# Patient Record
Sex: Male | Born: 1955 | Hispanic: Yes | Marital: Married | Smoking: Never smoker
Health system: Southern US, Community
[De-identification: ages and names within clinical notes are randomized; demographics above are authoritative.]

## PROBLEM LIST (undated history)

## (undated) DIAGNOSIS — F840 Autistic disorder: Secondary | ICD-10-CM

## (undated) DIAGNOSIS — E119 Type 2 diabetes mellitus without complications: Secondary | ICD-10-CM

## (undated) DIAGNOSIS — I10 Essential (primary) hypertension: Secondary | ICD-10-CM

---

## 2020-11-11 ENCOUNTER — Emergency Department (HOSPITAL_COMMUNITY): Payer: Self-pay

## 2020-11-11 ENCOUNTER — Inpatient Hospital Stay (HOSPITAL_COMMUNITY)
Admission: EM | Admit: 2020-11-11 | Discharge: 2020-11-16 | DRG: 981 | Disposition: A | Discharge status: Home / Self Care | Payer: Self-pay | Attending: Family Medicine | Admitting: Family Medicine

## 2020-11-11 ENCOUNTER — Encounter (HOSPITAL_COMMUNITY): Payer: Self-pay | Admitting: Emergency Medicine

## 2020-11-11 DIAGNOSIS — Z20822 Contact with and (suspected) exposure to covid-19: Secondary | ICD-10-CM | POA: Diagnosis present

## 2020-11-11 DIAGNOSIS — E1165 Type 2 diabetes mellitus with hyperglycemia: Secondary | ICD-10-CM | POA: Diagnosis present

## 2020-11-11 DIAGNOSIS — S0081XA Abrasion of other part of head, initial encounter: Secondary | ICD-10-CM | POA: Diagnosis present

## 2020-11-11 DIAGNOSIS — D649 Anemia, unspecified: Secondary | ICD-10-CM | POA: Diagnosis present

## 2020-11-11 DIAGNOSIS — R601 Generalized edema: Secondary | ICD-10-CM | POA: Diagnosis present

## 2020-11-11 DIAGNOSIS — E119 Type 2 diabetes mellitus without complications: Secondary | ICD-10-CM | POA: Diagnosis present

## 2020-11-11 DIAGNOSIS — N400 Enlarged prostate without lower urinary tract symptoms: Secondary | ICD-10-CM | POA: Diagnosis present

## 2020-11-11 DIAGNOSIS — E669 Obesity, unspecified: Secondary | ICD-10-CM | POA: Diagnosis present

## 2020-11-11 DIAGNOSIS — L02414 Cutaneous abscess of left upper limb: Secondary | ICD-10-CM | POA: Diagnosis present

## 2020-11-11 DIAGNOSIS — E871 Hypo-osmolality and hyponatremia: Principal | ICD-10-CM | POA: Diagnosis present

## 2020-11-11 DIAGNOSIS — I5030 Unspecified diastolic (congestive) heart failure: Secondary | ICD-10-CM | POA: Diagnosis present

## 2020-11-11 DIAGNOSIS — W19XXXA Unspecified fall, initial encounter: Secondary | ICD-10-CM | POA: Diagnosis present

## 2020-11-11 DIAGNOSIS — S80211A Abrasion, right knee, initial encounter: Secondary | ICD-10-CM | POA: Diagnosis present

## 2020-11-11 DIAGNOSIS — Z6830 Body mass index (BMI) 30.0-30.9, adult: Secondary | ICD-10-CM

## 2020-11-11 DIAGNOSIS — I110 Hypertensive heart disease with heart failure: Secondary | ICD-10-CM | POA: Diagnosis present

## 2020-11-11 DIAGNOSIS — E43 Unspecified severe protein-calorie malnutrition: Secondary | ICD-10-CM | POA: Diagnosis present

## 2020-11-11 DIAGNOSIS — K7460 Unspecified cirrhosis of liver: Secondary | ICD-10-CM

## 2020-11-11 DIAGNOSIS — S80212A Abrasion, left knee, initial encounter: Secondary | ICD-10-CM | POA: Diagnosis present

## 2020-11-11 DIAGNOSIS — E1169 Type 2 diabetes mellitus with other specified complication: Secondary | ICD-10-CM | POA: Diagnosis present

## 2020-11-11 DIAGNOSIS — F840 Autistic disorder: Secondary | ICD-10-CM | POA: Diagnosis present

## 2020-11-11 DIAGNOSIS — S0031XA Abrasion of nose, initial encounter: Secondary | ICD-10-CM | POA: Diagnosis present

## 2020-11-11 DIAGNOSIS — G9341 Metabolic encephalopathy: Secondary | ICD-10-CM | POA: Diagnosis present

## 2020-11-11 DIAGNOSIS — I4510 Unspecified right bundle-branch block: Secondary | ICD-10-CM | POA: Diagnosis present

## 2020-11-11 HISTORY — DX: Autistic disorder: F84.0

## 2020-11-11 HISTORY — DX: Essential (primary) hypertension: I10

## 2020-11-11 HISTORY — DX: Type 2 diabetes mellitus without complications: E11.9

## 2020-11-11 LAB — URINALYSIS, ROUTINE W REFLEX MICROSCOPIC
Bacteria, UA: NONE SEEN
Bilirubin Urine: NEGATIVE
Glucose, UA: 50 mg/dL — AB
Hgb urine dipstick: NEGATIVE
Ketones, ur: 5 mg/dL — AB
Leukocytes,Ua: NEGATIVE
Nitrite: NEGATIVE
Protein, ur: 30 mg/dL — AB
Specific Gravity, Urine: 1.025 (ref 1.005–1.030)
pH: 5 (ref 5.0–8.0)

## 2020-11-11 LAB — CBC WITH DIFFERENTIAL/PLATELET
Abs Immature Granulocytes: 0.08 10*3/uL — ABNORMAL HIGH (ref 0.00–0.07)
Basophils Absolute: 0 10*3/uL (ref 0.0–0.1)
Basophils Relative: 0 %
Eosinophils Absolute: 0 10*3/uL (ref 0.0–0.5)
Eosinophils Relative: 0 %
HCT: 34.2 % — ABNORMAL LOW (ref 39.0–52.0)
Hemoglobin: 11.8 g/dL — ABNORMAL LOW (ref 13.0–17.0)
Immature Granulocytes: 1 %
Lymphocytes Relative: 10 %
Lymphs Abs: 1 10*3/uL (ref 0.7–4.0)
MCH: 31.2 pg (ref 26.0–34.0)
MCHC: 34.5 g/dL (ref 30.0–36.0)
MCV: 90.5 fL (ref 80.0–100.0)
Monocytes Absolute: 1 10*3/uL (ref 0.1–1.0)
Monocytes Relative: 10 %
Neutro Abs: 8.1 10*3/uL — ABNORMAL HIGH (ref 1.7–7.7)
Neutrophils Relative %: 79 %
Platelets: 252 10*3/uL (ref 150–400)
RBC: 3.78 MIL/uL — ABNORMAL LOW (ref 4.22–5.81)
RDW: 11.9 % (ref 11.5–15.5)
WBC: 10.3 10*3/uL (ref 4.0–10.5)
nRBC: 0 % (ref 0.0–0.2)

## 2020-11-11 LAB — COMPREHENSIVE METABOLIC PANEL
ALT: 32 U/L (ref 0–44)
AST: 41 U/L (ref 15–41)
Albumin: 2.1 g/dL — ABNORMAL LOW (ref 3.5–5.0)
Alkaline Phosphatase: 74 U/L (ref 38–126)
Anion gap: 9 (ref 5–15)
BUN: 9 mg/dL (ref 8–23)
CO2: 24 mmol/L (ref 22–32)
Calcium: 7.7 mg/dL — ABNORMAL LOW (ref 8.9–10.3)
Chloride: 83 mmol/L — ABNORMAL LOW (ref 98–111)
Creatinine, Ser: 0.7 mg/dL (ref 0.61–1.24)
GFR, Estimated: >60 mL/min (ref >60)
Glucose, Bld: 185 mg/dL — ABNORMAL HIGH (ref 70–99)
Potassium: 3.8 mmol/L (ref 3.5–5.1)
Sodium: 116 mmol/L — CL (ref 135–145)
Total Bilirubin: 0.7 mg/dL (ref 0.3–1.2)
Total Protein: 6.1 g/dL — ABNORMAL LOW (ref 6.5–8.1)

## 2020-11-11 LAB — RESP PANEL BY RT-PCR (FLU A&B, COVID) ARPGX2
Influenza A by PCR: NEGATIVE
Influenza B by PCR: NEGATIVE
SARS Coronavirus 2 by RT PCR: NEGATIVE

## 2020-11-11 LAB — LACTIC ACID, PLASMA: Lactic Acid, Venous: 1.2 mmol/L (ref 0.5–1.9)

## 2020-11-11 LAB — BRAIN NATRIURETIC PEPTIDE: B Natriuretic Peptide: 47.4 pg/mL (ref 0.0–100.0)

## 2020-11-11 MED ORDER — ACETAMINOPHEN 325 MG PO TABS
650.0000 mg | ORAL_TABLET | Freq: Once | ORAL | Status: AC
  Administered 2020-11-11: 650 mg via ORAL
  Filled 2020-11-11: qty 2

## 2020-11-11 NOTE — ED Triage Notes (Signed)
Patient has autism ,friend reported increasing edema/swelling at bilateral lower legs , altered LOC and fell 5 days ago with skin abrasions at both knees. Patient is non verbal during encounter.

## 2020-11-11 NOTE — ED Provider Notes (Signed)
Emergency Medicine Provider Triage Evaluation Note  Christian Green , a 64 y.o. male  was evaluated in triage.  Pt complains of lower extremity edema and shortness of breath. Here visiting from [place]. History of autism. Friend at bedside notes patient has been less responsive over the past 3-4 days. Increased lower extremity edema. Seen at [facility] prior to arrival and sent to the ED to rule out CHF.   Level 5 caveat secondary to nonverbal  Review of Systems  Positive: SOB, lower extremity edema Negative:   Physical Exam  BP 128/73 (BP Location: Right Arm)   Pulse 81   Temp (!) 100.8 F (38.2 C)   Resp 16   SpO2 97%  Gen:   Awake, no distress   Resp:  Normal effort  MSK:   Moves extremities without difficulty  Other:  2+ pitting edema bilaterally  Medical Decision Making  Medically screening exam initiated at 7:39 PM.  Appropriate orders placed.  Christian Green was informed that the remainder of the evaluation will be completed by another provider, this initial triage assessment does not replace that evaluation, and the importance of remaining in the ED until their evaluation is complete.  Labs COVID CXR   Christian Green 11/11/20 1952    Christian Grizzle, MD 11/12/20 [phone]

## 2020-11-12 ENCOUNTER — Other Ambulatory Visit: Payer: Self-pay

## 2020-11-12 ENCOUNTER — Emergency Department (HOSPITAL_COMMUNITY): Payer: Self-pay

## 2020-11-12 DIAGNOSIS — E43 Unspecified severe protein-calorie malnutrition: Secondary | ICD-10-CM | POA: Diagnosis present

## 2020-11-12 DIAGNOSIS — N401 Enlarged prostate with lower urinary tract symptoms: Secondary | ICD-10-CM

## 2020-11-12 DIAGNOSIS — E669 Obesity, unspecified: Secondary | ICD-10-CM

## 2020-11-12 DIAGNOSIS — L02414 Cutaneous abscess of left upper limb: Secondary | ICD-10-CM | POA: Diagnosis present

## 2020-11-12 DIAGNOSIS — R601 Generalized edema: Principal | ICD-10-CM | POA: Diagnosis present

## 2020-11-12 DIAGNOSIS — F840 Autistic disorder: Secondary | ICD-10-CM | POA: Diagnosis present

## 2020-11-12 DIAGNOSIS — I4510 Unspecified right bundle-branch block: Secondary | ICD-10-CM | POA: Diagnosis present

## 2020-11-12 DIAGNOSIS — G9341 Metabolic encephalopathy: Secondary | ICD-10-CM | POA: Diagnosis present

## 2020-11-12 DIAGNOSIS — N400 Enlarged prostate without lower urinary tract symptoms: Secondary | ICD-10-CM | POA: Diagnosis present

## 2020-11-12 DIAGNOSIS — E871 Hypo-osmolality and hyponatremia: Principal | ICD-10-CM

## 2020-11-12 DIAGNOSIS — E1169 Type 2 diabetes mellitus with other specified complication: Secondary | ICD-10-CM | POA: Diagnosis present

## 2020-11-12 DIAGNOSIS — D649 Anemia, unspecified: Secondary | ICD-10-CM | POA: Diagnosis present

## 2020-11-12 LAB — OSMOLALITY, URINE: Osmolality, Ur: 342 mOsm/kg (ref 300–900)

## 2020-11-12 LAB — BASIC METABOLIC PANEL
Anion gap: 7 (ref 5–15)
Anion gap: 9 (ref 5–15)
BUN: 9 mg/dL (ref 8–23)
BUN: 8 mg/dL (ref 8–23)
CO2: 27 mmol/L (ref 22–32)
CO2: 26 mmol/L (ref 22–32)
Calcium: 7.9 mg/dL — ABNORMAL LOW (ref 8.9–10.3)
Calcium: 7.9 mg/dL — ABNORMAL LOW (ref 8.9–10.3)
Chloride: 88 mmol/L — ABNORMAL LOW (ref 98–111)
Chloride: 90 mmol/L — ABNORMAL LOW (ref 98–111)
Creatinine, Ser: 0.62 mg/dL (ref 0.61–1.24)
Creatinine, Ser: 0.69 mg/dL (ref 0.61–1.24)
GFR, Estimated: >60 mL/min (ref >60)
GFR, Estimated: >60 mL/min (ref >60)
Glucose, Bld: 149 mg/dL — ABNORMAL HIGH (ref 70–99)
Glucose, Bld: 146 mg/dL — ABNORMAL HIGH (ref 70–99)
Potassium: 3.7 mmol/L (ref 3.5–5.1)
Potassium: 3.6 mmol/L (ref 3.5–5.1)
Sodium: 122 mmol/L — ABNORMAL LOW (ref 135–145)
Sodium: 125 mmol/L — ABNORMAL LOW (ref 135–145)

## 2020-11-12 LAB — RAPID URINE DRUG SCREEN, HOSP PERFORMED
Amphetamines: NOT DETECTED
Barbiturates: NOT DETECTED
Benzodiazepines: NOT DETECTED
Cocaine: NOT DETECTED
Opiates: NOT DETECTED
Tetrahydrocannabinol: NOT DETECTED

## 2020-11-12 LAB — PREALBUMIN: Prealbumin: <5 mg/dL — ABNORMAL LOW (ref 18–38)

## 2020-11-12 LAB — HEMOGLOBIN A1C
Hgb A1c MFr Bld: 9.1 % — ABNORMAL HIGH (ref 4.8–5.6)
Mean Plasma Glucose: 214.47 mg/dL

## 2020-11-12 LAB — CBG MONITORING, ED: Glucose-Capillary: 181 mg/dL — ABNORMAL HIGH (ref 70–99)

## 2020-11-12 LAB — TSH: TSH: 1.683 u[IU]/mL (ref 0.350–4.500)

## 2020-11-12 LAB — SODIUM, URINE, RANDOM: Sodium, Ur: <10 mmol/L

## 2020-11-12 LAB — TROPONIN I (HIGH SENSITIVITY)
Troponin I (High Sensitivity): 7 ng/L (ref <18)
Troponin I (High Sensitivity): 16 ng/L (ref <18)
Troponin I (High Sensitivity): 5 ng/L (ref <18)

## 2020-11-12 LAB — LACTIC ACID, PLASMA: Lactic Acid, Venous: 1.2 mmol/L (ref 0.5–1.9)

## 2020-11-12 LAB — OSMOLALITY: Osmolality: 266 mOsm/kg — ABNORMAL LOW (ref 275–295)

## 2020-11-12 MED ORDER — ACETAMINOPHEN 650 MG RE SUPP: 650.0000 mg | Freq: Four times a day (QID) | RECTAL | Status: DC | PRN

## 2020-11-12 MED ORDER — CEFAZOLIN SODIUM-DEXTROSE 2-4 GM/100ML-% IV SOLN
2.0000 g | Freq: Three times a day (TID) | INTRAVENOUS | Status: DC
  Administered 2020-11-12 – 2020-11-15 (×8): 2 g via INTRAVENOUS
  Filled 2020-11-12 (×9): qty 100

## 2020-11-12 MED ORDER — ENALAPRIL MALEATE 5 MG PO TABS
10.0000 mg | ORAL_TABLET | Freq: Two times a day (BID) | ORAL | Status: DC
  Filled 2020-11-12: qty 2

## 2020-11-12 MED ORDER — ALBUTEROL SULFATE (2.5 MG/3ML) 0.083% IN NEBU: 2.5000 mg | INHALATION_SOLUTION | Freq: Four times a day (QID) | RESPIRATORY_TRACT | Status: DC | PRN

## 2020-11-12 MED ORDER — GLUCERNA SHAKE PO LIQD
237.0000 mL | Freq: Three times a day (TID) | ORAL | Status: DC
  Administered 2020-11-12 – 2020-11-16 (×11): 237 mL via ORAL
  Filled 2020-11-12: qty 237

## 2020-11-12 MED ORDER — ONDANSETRON HCL 4 MG PO TABS: 4.0000 mg | ORAL_TABLET | Freq: Four times a day (QID) | ORAL | Status: DC | PRN

## 2020-11-12 MED ORDER — VANCOMYCIN HCL 1750 MG/350ML IV SOLN
1750.0000 mg | Freq: Once | INTRAVENOUS | Status: AC
  Administered 2020-11-12: 1750 mg via INTRAVENOUS
  Filled 2020-11-12: qty 350

## 2020-11-12 MED ORDER — ENOXAPARIN SODIUM 40 MG/0.4ML IJ SOSY
40.0000 mg | PREFILLED_SYRINGE | INTRAMUSCULAR | Status: DC
  Administered 2020-11-12 – 2020-11-15 (×4): 40 mg via SUBCUTANEOUS
  Filled 2020-11-12 (×4): qty 0.4

## 2020-11-12 MED ORDER — ACETAMINOPHEN 325 MG PO TABS
650.0000 mg | ORAL_TABLET | Freq: Four times a day (QID) | ORAL | Status: DC | PRN
  Administered 2020-11-14: 650 mg via ORAL
  Filled 2020-11-12: qty 2

## 2020-11-12 MED ORDER — IOHEXOL 350 MG/ML SOLN
100.0000 mL | Freq: Once | INTRAVENOUS | Status: AC | PRN
  Administered 2020-11-12: 100 mL via INTRAVENOUS

## 2020-11-12 MED ORDER — COLLAGENASE 250 UNIT/GM EX OINT: TOPICAL_OINTMENT | Freq: Every day | CUTANEOUS | Status: DC

## 2020-11-12 MED ORDER — LIDOCAINE-EPINEPHRINE 1 %-1:100000 IJ SOLN
10.0000 mL | Freq: Once | INTRAMUSCULAR | Status: AC
  Administered 2020-11-12: 10 mL via INTRADERMAL
  Filled 2020-11-12: qty 1

## 2020-11-12 MED ORDER — SODIUM CHLORIDE 0.9% FLUSH
3.0000 mL | Freq: Two times a day (BID) | INTRAVENOUS | Status: DC
  Administered 2020-11-12 – 2020-11-16 (×6): 3 mL via INTRAVENOUS

## 2020-11-12 MED ORDER — SODIUM CHLORIDE 0.9 % IV SOLN
1.0000 g | Freq: Once | INTRAVENOUS | Status: AC
  Administered 2020-11-12: 1 g via INTRAVENOUS
  Filled 2020-11-12: qty 10

## 2020-11-12 MED ORDER — CLINDAMYCIN PHOSPHATE 600 MG/50ML IV SOLN
600.0000 mg | Freq: Once | INTRAVENOUS | Status: AC
  Administered 2020-11-12: 600 mg via INTRAVENOUS
  Filled 2020-11-12: qty 50

## 2020-11-12 MED ORDER — PIPERACILLIN-TAZOBACTAM 3.375 G IVPB 30 MIN
3.3750 g | Freq: Once | INTRAVENOUS | Status: AC
  Administered 2020-11-12: 3.375 g via INTRAVENOUS
  Filled 2020-11-12: qty 50

## 2020-11-12 MED ORDER — INSULIN ASPART 100 UNIT/ML IJ SOLN
0.0000 [IU] | Freq: Three times a day (TID) | INTRAMUSCULAR | Status: DC
  Administered 2020-11-12: 1 [IU] via SUBCUTANEOUS
  Administered 2020-11-13: 2 [IU] via SUBCUTANEOUS
  Administered 2020-11-13 – 2020-11-14 (×2): 1 [IU] via SUBCUTANEOUS
  Administered 2020-11-14: 2 [IU] via SUBCUTANEOUS
  Administered 2020-11-14: 4 [IU] via SUBCUTANEOUS
  Administered 2020-11-15: 2 [IU] via SUBCUTANEOUS
  Administered 2020-11-15 – 2020-11-16 (×3): 1 [IU] via SUBCUTANEOUS

## 2020-11-12 MED ORDER — FUROSEMIDE 10 MG/ML IJ SOLN
20.0000 mg | Freq: Once | INTRAMUSCULAR | Status: AC
  Administered 2020-11-12: 20 mg via INTRAVENOUS
  Filled 2020-11-12: qty 2

## 2020-11-12 MED ORDER — ONDANSETRON HCL 4 MG/2ML IJ SOLN: 4.0000 mg | Freq: Four times a day (QID) | INTRAMUSCULAR | Status: DC | PRN

## 2020-11-12 MED ORDER — PIPERACILLIN-TAZOBACTAM 3.375 G IVPB: 3.3750 g | Freq: Three times a day (TID) | INTRAVENOUS | Status: DC

## 2020-11-12 MED ORDER — VANCOMYCIN HCL 1500 MG/300ML IV SOLN
1500.0000 mg | Freq: Two times a day (BID) | INTRAVENOUS | Status: DC
  Filled 2020-11-12: qty 300

## 2020-11-12 NOTE — ED Notes (Signed)
RN called Charma Igo, PA to let them know pt is ready. Lidocaine and consent at bedside.

## 2020-11-12 NOTE — Procedures (Signed)
Procedure: Left posterior shoulder I&D   Indication: Left shoulder abscess   Surgeon: Charma Igo, PA-C   Assist: None   Anesthesia: 33ml 1% lidocaine with epi   EBL: Minimal   Complications: None   Findings: After risks/benefits explained patient's family desires to undergo procedure. Consent obtained and time out performed. The left posterior shoulder was sterilely prepped and anesthetic infiltrated. A 6cm incision was made in the posterior shoulder and copious purulence encountered. Sample collected for C&S. Wound irrigated with 1L NS and packed with Kerlix. Pt tolerated the procedure well.       Freeman Caldron, PA-C Orthopedic Surgery [phone]

## 2020-11-12 NOTE — Plan of Care (Signed)

## 2020-11-12 NOTE — ED Provider Notes (Signed)
Critical care team has reviewed the current labs and clinical picture.  At this time they do not feel that patient needs a formal consult.  Appropriate currently for admission to medical service and future consult if needed.   Arby Barrette, MD 11/12/20 1005

## 2020-11-12 NOTE — H&P (Signed)
History and Physical    Christian LanceJorge Adduci ZOX:[id number] DOB: 10/27/1955 DOA: 11/11/2020  Referring MD/NP/PA: Arby BarretteMarcy Pfeiffer, MD PCP: Pcp, No  Patient coming from: Via private vehicle  Chief Complaint: Leg swelling  I have personally briefly reviewed [facility]   HPI: Christian Green is a 65 y.o. male from [place] with medical history significant of hypertension, diabetes mellitus type 2, autism nonverbal at baseline who presents with complaints of leg swelling over the last 5 days.  History is obtained with the use of the interpreter services and talking with the patient's sister who is present at bedside.  He is visiting from [place] and has been here for approximately a couple months.  About 3 days ago he had complained of feeling dizzy and had fallen forward scraping his face and knees.  There were no reports of patient losing consciousness.  His sister also had recently noted that his blood sugars were running in the 200s for which she cut out all sugar from his diet.  Normally, patient has to be encouraged to drink water and does not drink excessive amounts on its own.  She also reports that he had been more agitated and normally is very calm and cooperative.  He does not report any complaints of pain.  He does not drink alcohol smoke tobacco, or do any illicit drugs.   ED Course: Upon admission into the emergency department patient was noted to be follow-up 100.8 F, respirations 14-28, heart rate 66-97, blood pressures 91/65-153/79, and O2 saturations maintained on room air.  Initial labs from 7/18 revealed WBC 10.3 with elevated neutrophil count, hemoglobin 11.8, sodium 116, CO2 83, BUN 9, creatinine 0.7, calcium 7.7, albumin 2.1 BNP 47.7, and high-sensitivity troponin negative x2.  Chest x-ray noted to have elevated left hemidiaphragm.  CT a of the chest did not note any signs of a PE but did note a large partially covered gas and fluid collection within the left  shoulder thought to be an abscess.  CT scan of the abdomen was significant for mild anasarca had enlarged prostate with moderately distended bladder.  Urinalysis did not show significant concern for infection, but did have glucose and ketones present.  Blood cultures have been obtained.  Orthopedics had been consulted and a dedicated CT scan of the shoulder was obtained which showed superficial abscess.  Patient has been given Lasix 20 mg IV x1 dose, Rocephin, clindamycin, vancomycin, and Zosyn.  Review of Systems  Unable to perform ROS: Patient nonverbal  Respiratory:  Positive for shortness of breath.   Cardiovascular:  Positive for leg swelling.  Skin:        Positive for abrasion to the face and knees  Neurological:  Positive for dizziness. Negative for loss of consciousness.   Past Medical History:  Diagnosis Date   Autism    Diabetes mellitus without complication (HCC)    Hypertension     History reviewed. No pertinent surgical history.   reports that he does not drink alcohol and does not use drugs. No history on file for tobacco use.  No Known Allergies  No family history on file.  Prior to Admission medications   Not on File    Physical Exam:  Constitutional: Elderly male currently in NAD, calm, and in no acute distress Vitals:   11/12/20 0845 11/12/20 0850 11/12/20 0921 11/12/20 0948  BP:  114/70  119/74  Pulse: 89 91 88 83  Resp:  20 (!) 26 (!) 26  Temp:      TempSrc:      SpO2: 99% 98% 97% 97%  Weight:       Eyes: PERRL, lids and conjunctivae normal ENMT: Mucous membranes are dry posterior pharynx clear of any exudate or lesions. Neck: normal, supple, no masses, no thyromegaly Respiratory: clear to auscultation bilaterally, no wheezing, no crackles. Normal respiratory effort. No accessory muscle use.  Cardiovascular: Regular rate and rhythm, no murmurs / rubs / gallops. 2+ lower extremity edema. 2+ pedal pulses. No carotid bruits.  Abdomen: No abdominal  tenderness.  Patient with some Musculoskeletal: no clubbing / cyanosis.   Skin: Abrasions noted to the nose, chin, and bilateral oblique knees without significant erythema. Left shoulder status post I&D with bandage present. Neurologic: CN 2-12 grossly intact. Sensation intact, DTR normal. Strength 5/5 in all 4.  Psychiatric: Alert.  Patient nonverbal at baseline    Labs on Admission: I have personally reviewed following labs and imaging studies  CBC: Recent Labs  Lab 11/11/20 1943  WBC 10.3  NEUTROABS 8.1*  HGB 11.8*  HCT 34.2*  MCV 90.5  PLT 252   Basic Metabolic Panel: Recent Labs  Lab 11/11/20 1943 11/12/20 0821  NA 116* 122*  K 3.8 3.7  CL 83* 88*  CO2 24 27  GLUCOSE 185* 149*  BUN 9 9  CREATININE 0.70 0.62  CALCIUM 7.7* 7.9*   GFR: CrCl cannot be calculated (Unknown ideal weight.). Liver Function Tests: Recent Labs  Lab 11/11/20 1943  AST 41  ALT 32  ALKPHOS 74  BILITOT 0.7  PROT 6.1*  ALBUMIN 2.1*   No results for input(s): LIPASE, AMYLASE in the last 168 hours. No results for input(s): AMMONIA in the last 168 hours. Coagulation Profile: No results for input(s): INR, PROTIME in the last 168 hours. Cardiac Enzymes: No results for input(s): CKTOTAL, CKMB, CKMBINDEX, TROPONINI in the last 168 hours. BNP (last 3 results) No results for input(s): PROBNP in the last 8760 hours. HbA1C: No results for input(s): HGBA1C in the last 72 hours. CBG: No results for input(s): GLUCAP in the last 168 hours. Lipid Profile: No results for input(s): CHOL, HDL, LDLCALC, TRIG, CHOLHDL, LDLDIRECT in the last 72 hours. Thyroid Function Tests: No results for input(s): TSH, T4TOTAL, FREET4, T3FREE, THYROIDAB in the last 72 hours. Anemia Panel: No results for input(s): VITAMINB12, FOLATE, FERRITIN, TIBC, IRON, RETICCTPCT in the last 72 hours. Urine analysis:    Component Value Date/Time   COLORURINE AMBER (A) 11/11/2020 1941   APPEARANCEUR HAZY (A) 11/11/2020 1941    LABSPEC 1.025 11/11/2020 1941   PHURINE 5.0 11/11/2020 1941   GLUCOSEU 50 (A) 11/11/2020 1941   HGBUR NEGATIVE 11/11/2020 1941   BILIRUBINUR NEGATIVE 11/11/2020 1941   KETONESUR 5 (A) 11/11/2020 1941   PROTEINUR 30 (A) 11/11/2020 1941   NITRITE NEGATIVE 11/11/2020 1941   LEUKOCYTESUR NEGATIVE 11/11/2020 1941   Sepsis Labs: Recent Results (from the past 240 hour(s))  Resp Panel by RT-PCR (Flu A&B, Covid) Nasopharyngeal Swab     Status: None   Collection Time: 11/11/20  7:45 PM   Specimen: Nasopharyngeal Swab; Nasopharyngeal(NP) swabs in vial transport medium  Result Value Ref Range Status   SARS Coronavirus 2 by RT PCR NEGATIVE NEGATIVE Final    Comment: (NOTE) SARS-CoV-2 target nucleic acids are NOT DETECTED.  The SARS-CoV-2 RNA is generally detectable in upper respiratory specimens during the acute phase of infection. The lowest concentration of SARS-CoV-2 viral copies this assay can detect is 138 copies/mL. A negative result  does not preclude SARS-Cov-2 infection and should not be used as the sole basis for treatment or other patient management decisions. A negative result may occur with  improper specimen collection/handling, submission of specimen other than nasopharyngeal swab, presence of viral mutation(s) within the areas targeted by this assay, and inadequate number of viral copies(<138 copies/mL). A negative result must be combined with clinical observations, patient history, and epidemiological information. The expected result is Negative.  Fact Sheet for Patients:  BloggerCourse.com  Fact Sheet for Healthcare Providers:  SeriousBroker.it  This test is no t yet approved or cleared by the [place] FDA and  has been authorized for detection and/or diagnosis of SARS-CoV-2 by FDA under an Emergency Use Authorization (EUA). This EUA will remain  in effect (meaning this test can be used) for the duration of  the COVID-19 declaration under Section 564(b)(1) of the Act, 21 U.S.C.section 360bbb-3(b)(1), unless the authorization is terminated  or revoked sooner.       Influenza A by PCR NEGATIVE NEGATIVE Final   Influenza B by PCR NEGATIVE NEGATIVE Final    Comment: (NOTE) The Xpert Xpress SARS-CoV-2/FLU/RSV plus assay is intended as an aid in the diagnosis of influenza from Nasopharyngeal swab specimens and should not be used as a sole basis for treatment. Nasal washings and aspirates are unacceptable for Xpert Xpress SARS-CoV-2/FLU/RSV testing.  Fact Sheet for Patients: BloggerCourse.com  Fact Sheet for Healthcare Providers: SeriousBroker.it  This test is not yet approved or cleared by the [place] FDA and has been authorized for detection and/or diagnosis of SARS-CoV-2 by FDA under an Emergency Use Authorization (EUA). This EUA will remain in effect (meaning this test can be used) for the duration of the COVID-19 declaration under Section 564(b)(1) of the Act, 21 U.S.C. section 360bbb-3(b)(1), unless the authorization is terminated or revoked.  Performed at [facility], [address], [place], [place] [address]      Radiological Exams on Admission: DG Chest 2 View  Result Date: 11/11/2020 CLINICAL DATA:  Shortness of breath EXAM: CHEST - 2 VIEW COMPARISON:  None. FINDINGS: Elevation of left diaphragm. Air distended bowel in the left upper quadrant. No consolidation, pleural effusion or pneumothorax. Normal cardiac size. IMPRESSION: Elevation of left diaphragm. Air distended bowel in the left upper quadrant which may be evaluated with dedicated abdominal radiograph Electronically Signed   By: Jasmine Pang M.D.   On: 11/11/2020 20:14   CT Head Wo Contrast  Result Date: 11/12/2020 CLINICAL DATA:  Hyponatremia EXAM: CT HEAD WITHOUT CONTRAST TECHNIQUE: Contiguous axial images were obtained from the base of the skull  through the vertex without intravenous contrast. COMPARISON:  None. FINDINGS: Brain: No evidence of acute infarction, hemorrhage, hydrocephalus, extra-axial collection or mass lesion/mass effect. Mild atrophic changes and chronic white matter ischemic changes are noted. Vascular: No hyperdense vessel or unexpected calcification. Skull: Normal. Negative for fracture or focal lesion. Sinuses/Orbits: No acute finding. Other: None. IMPRESSION: Chronic atrophic and ischemic changes without acute abnormality. Electronically Signed   By: Alcide Clever M.D.   On: 11/12/2020 08:27   CT Angio Chest PE W and/or Wo Contrast  Result Date: 11/12/2020 CLINICAL DATA:  Fever and diarrhea EXAM: CT ANGIOGRAPHY CHEST CT ABDOMEN AND PELVIS WITH CONTRAST TECHNIQUE: Multidetector CT imaging of the chest was performed using the standard protocol during bolus administration of intravenous contrast. Multiplanar CT image reconstructions and MIPs were obtained to evaluate the vascular anatomy. Multidetector CT imaging of the abdomen and pelvis was performed using the  standard protocol during bolus administration of intravenous contrast. CONTRAST:  OMNIPAQUE IOHEXOL 350 MG/ML SOLN COMPARISON:  None. FINDINGS: CTA CHEST FINDINGS Cardiovascular: Normal heart size. No pericardial effusion. No acute aortic finding. No pulmonary artery filling defect Mediastinum/Nodes: Negative for mediastinal adenopathy or mass. Left axillary adenopathy associated with the following musculoskeletal findings. Lungs/Pleura: Bands of opacity in the bilateral lungs consistent with atelectasis. There is no edema, consolidation, effusion, or pneumothorax. Musculoskeletal: Large partially covered subcutaneous gas and fluid collection posterior to the left shoulder, insinuating along the musculature. Dimensions are at least 9 x 5 cm. No evidence of underlying bony erosion. Generalized spondylosis with scoliosis. Review of the MIP images confirms the above  findings. Case discussed with Dr. Eudelia Bunch. [place] and autistic with limited history. CT ABDOMEN and PELVIS FINDINGS Hepatobiliary: No focal liver abnormality.Cholecystectomy. Pancreas: Unremarkable. Spleen: Unremarkable. Adrenals/Urinary Tract: Negative adrenals. No hydronephrosis or stone. Moderately distended bladder. Stomach/Bowel: No obstruction. Multiple segments of gas distended colon but no generalized obstructive pattern. Vascular/Lymphatic: No acute vascular abnormality. No mass or adenopathy. Reproductive:Enlarged prostate with central gland projecting into the bladder base. Other: No ascites or pneumoperitoneum. Musculoskeletal: Spondylosis.  No acute or focal finding. Review of the MIP images confirms the above findings. IMPRESSION: Chest CTA: 1. Large, partially covered gas and fluid collection about the left shoulder, presumably abscess. 2. Negative for pulmonary embolism. Abdominal CT: 1. No acute finding. 2. Mild anasarca. 3. Enlarged prostate with moderately distended bladder. Electronically Signed   By: Marnee Spring M.D.   On: 11/12/2020 05:21   CT ABDOMEN PELVIS W CONTRAST  Result Date: 11/12/2020 CLINICAL DATA:  Fever and diarrhea EXAM: CT ANGIOGRAPHY CHEST CT ABDOMEN AND PELVIS WITH CONTRAST TECHNIQUE: Multidetector CT imaging of the chest was performed using the standard protocol during bolus administration of intravenous contrast. Multiplanar CT image reconstructions and MIPs were obtained to evaluate the vascular anatomy. Multidetector CT imaging of the abdomen and pelvis was performed using the standard protocol during bolus administration of intravenous contrast. CONTRAST:  OMNIPAQUE IOHEXOL 350 MG/ML SOLN COMPARISON:  None. FINDINGS: CTA CHEST FINDINGS Cardiovascular: Normal heart size. No pericardial effusion. No acute aortic finding. No pulmonary artery filling defect Mediastinum/Nodes: Negative for mediastinal adenopathy or mass. Left  axillary adenopathy associated with the following musculoskeletal findings. Lungs/Pleura: Bands of opacity in the bilateral lungs consistent with atelectasis. There is no edema, consolidation, effusion, or pneumothorax. Musculoskeletal: Large partially covered subcutaneous gas and fluid collection posterior to the left shoulder, insinuating along the musculature. Dimensions are at least 9 x 5 cm. No evidence of underlying bony erosion. Generalized spondylosis with scoliosis. Review of the MIP images confirms the above findings. Case discussed with Dr. Eudelia Bunch. [place] and autistic with limited history. CT ABDOMEN and PELVIS FINDINGS Hepatobiliary: No focal liver abnormality.Cholecystectomy. Pancreas: Unremarkable. Spleen: Unremarkable. Adrenals/Urinary Tract: Negative adrenals. No hydronephrosis or stone. Moderately distended bladder. Stomach/Bowel: No obstruction. Multiple segments of gas distended colon but no generalized obstructive pattern. Vascular/Lymphatic: No acute vascular abnormality. No mass or adenopathy. Reproductive:Enlarged prostate with central gland projecting into the bladder base. Other: No ascites or pneumoperitoneum. Musculoskeletal: Spondylosis.  No acute or focal finding. Review of the MIP images confirms the above findings. IMPRESSION: Chest CTA: 1. Large, partially covered gas and fluid collection about the left shoulder, presumably abscess. 2. Negative for pulmonary embolism. Abdominal CT: 1. No acute finding. 2. Mild anasarca. 3. Enlarged prostate with moderately distended bladder. Electronically Signed  By: Marnee Spring M.D.   On: 11/12/2020 05:21   CT Shoulder Left Wo Contrast  Result Date: 11/12/2020 CLINICAL DATA:  Shoulder abscess. EXAM: CT OF THE UPPER LEFT EXTREMITY WITHOUT CONTRAST TECHNIQUE: Multidetector CT imaging of the left shoulder was performed according to the standard protocol. COMPARISON:  None. FINDINGS: Bones/Joint/Cartilage No  bony destruction or periosteal reaction. No fracture or dislocation. Mild degenerative changes of the acromioclavicular and glenohumeral joints. No joint effusion. Ligaments Ligaments are suboptimally evaluated by CT. Muscles and Tendons Grossly intact.  No muscle atrophy. Soft tissue Superficial large 6.2 x 14.3 x 8.3 cm gas and fluid collection overlying the posterior shoulder. Reactive left axillary lymph nodes. No soft tissue mass. Mild linear scarring/atelectasis in the left lung. Calcified granuloma in the lingula. IMPRESSION: 1. Superficial 6.2 x 14.3 x 8.3 cm abscess overlying the posterior shoulder. 2. No acute osseous abnormality. Electronically Signed   By: Obie Dredge M.D.   On: 11/12/2020 08:13   DG Knee Complete 4 Views Left  Result Date: 11/12/2020 CLINICAL DATA:  Swelling.  History of fall. EXAM: LEFT KNEE - COMPLETE 4+ VIEW COMPARISON:  No prior. FINDINGS: Degenerative changes left knee, most prominent about the medial compartment. No evidence of acute fracture or dislocation. No effusion noted. IMPRESSION: Degenerative changes left knee.  No acute bony or joint abnormality. Electronically Signed   By: Maisie Fus  Register   On: 11/12/2020 06:40   DG Knee Complete 4 Views Right  Result Date: 11/12/2020 CLINICAL DATA:  Swelling.  Prior history of fall. EXAM: RIGHT KNEE - COMPLETE 4+ VIEW COMPARISON:  No recent prior. FINDINGS: Degenerative change noted about the right knee. Degenerative changes most prominent about the medial compartment. No evidence of acute fracture or dislocation. No prominent effusion noted. Mild peripheral vascular calcification cannot be excluded. IMPRESSION: Degenerative changes right knee. No evidence of acute fracture or dislocation. Electronically Signed   By: Maisie Fus  Register   On: 11/12/2020 06:39    EKG: Independently reviewed.  Sinus rhythm at 74 bpm with right bundle branch block.  Assessment/Plan Hyponatremia: Acute.  Upon admission to the emergency  department patient initially found to have a sodium of 116 with note of generalized edema.  Patient had appeared to be fluid overloaded and had been given Lasix 20 mg IV x1 dose.  Repeat sodium 122 with serum osmolarity noted to be low at 266.  Concerning for SIADH, hypothyroidism, psychogenic polydipsia, or adrenal insufficiency. -Admit to a medical telemetry bed -Continue serial monitoring of sodium levels -Check TSH. cortisol level in a.m. -Fluid restriction of 1500 ml daily  Left shoulder abscess fever: Acute.  Patient had been noted to have fever up to 100.8 F.  Incidentally been noted to have a left shoulder abscess on CT scan of the chest.  Follow-up dedicated shoulder CT noted 6.2 x 14.3 x 8.3 cm abscess overlying the posterior shoulder.   Orthopedics had been consulted and performed bedside I&D and sent fluid for culture. -Follow-up blood and wound cultures -Appreciate orthopedic consultative services, will follow-up for any further recommendation -Case was discussed with Dr. Algis Liming over the phone in regards to Gram stains from wound culture who recommended placing patient on cefazolin IV.  Anasarca: Acute.  Patient was noted to have swelling of his abdomen as well as bilateral lower extremities.  Question if secondary to third spacing to with patient's low albumin. -Consider need of further diuresis  Severe protein calorie malnutrition: Albumin initially noted to be 2.1 -Check prealbumin(<5) -Glucerna shakes in  between meals -Dietitian consult for an  Acute metabolic encephalopathy autism: Patient appears to be autistic and is nonverbal at baseline.  Family noted that he had recently been intermittently agitated and is normally calm.  Unclear if this is possibly related to his sodium levels. -Continue to monitor  Right bundle branch block: No prior EKG to compare.  High-sensitivity troponins were negative and patient did not report any complaints of chest pain -Check  echocardiogram  Diabetes mellitus type 2: Patient reportedly had been noted to have elevated glucose levels.  His sister reports that she had tried to limit patient's intake if carbohydrates and sugars.  On admission glucose 185 -Check hemoglobin A1c -Hypoglycemic protocol -CBGs before every meal with very sensitive SSI -Consider discontinuing checks if blood sugars remain stable  Essential hypertension: On admission blood pressures noted to be intermittently as low as 91/65.  Home blood pressure medications include enalapril 10 mg twice daily. -Initially held due to soft blood pressures -Continue to monitor and determine when medically appropriate to resume  Normocytic anemia: Hemoglobin 11.8 g/dL.  No reports of bleeding. -Continue to monitor  BPH: CT scan of the abdomen pelvis noted large prostate with moderately distended bladder. -Check bladder scans -Consider starting Flomax  Obesity: 30.7 kg/m  DVT prophylaxis: Lovenox Code Status: Full Family Communication: Sister updated at bedside Disposition Plan: Likely discharge back with family once medically stable Consults called: Orthopedics Admission status: Inpatient, require more than 2 midnight stay  Clydie Braun MD [facility]   If 7PM-7AM, please contact night-coverage   11/12/2020, 10:30 AM

## 2020-11-12 NOTE — ED Provider Notes (Signed)
[facility] EMERGENCY DEPARTMENT Provider Note  CSN: [id number] Arrival date & time: 11/11/20 1733  Chief Complaint(s) Lower legs edema  HPI Vollie Brunty is a 65 y.o. male with a past medical history of autism and hypertension who presents to the emergency department for almost 1 week of bilateral lower extremity edema and shortness of breath.  Patient is here visiting from [place] and has been here for almost 2 months.  Patient has been in his normal state of health up until last week.  Family and friends noted that the patient had been less talkative and more fatigued than usual.  They noted that the patient had been developing lower extremity edema and have been cutting down on his sodium intake.  They also checked the patient's CBG and noted that he was in the 200s.  The patient does not have a prior history of diabetes.  They report that the patient had a fall several days ago where he landed on his knees.  He denied any pain from the fall.  However family and friends report that the patient never complains of pain.  Remainder of history, ROS, and physical exam limited due to patient's condition (autism, minimal verbal response). Additional information was obtained from friends and family.   Level V Caveat.   HPI  Past Medical History Past Medical History:  Diagnosis Date   Autism    Diabetes mellitus without complication (HCC)    Hypertension    There are no problems to display for this patient.  Home Medication(s) Prior to Admission medications   Not on File                                                                                                                                    Past Surgical History History reviewed. No pertinent surgical history. Family History No family history on file.  Social History Social History   Tobacco Use   Smoking status: Never  Substance Use Topics   Alcohol use: Never   Drug use: Never   Allergies Patient  has no known allergies.  Review of Systems Review of Systems Unable to obtain due to baseline autism  Physical Exam Vital Signs  I have reviewed the triage vital signs BP 91/65   Pulse 95   Temp 98.1 F (36.7 C) (Oral)   Resp (!) 22   SpO2 99%   Physical Exam Vitals reviewed.  Constitutional:      General: He is not in acute distress.    Appearance: He is well-developed. He is not diaphoretic.  HENT:     Head: Normocephalic and atraumatic.     Nose: Nose normal.  Eyes:     General: No scleral icterus.       Right eye: No discharge.        Left eye: No discharge.     Conjunctiva/sclera: Conjunctivae normal.     Pupils: Pupils  are equal, round, and reactive to light.  Cardiovascular:     Rate and Rhythm: Normal rate and regular rhythm.     Heart sounds: No murmur heard.   No friction rub. No gallop.  Pulmonary:     Effort: Pulmonary effort is normal. No respiratory distress.     Breath sounds: Normal breath sounds. No stridor. No rales.  Abdominal:     General: There is no distension.     Palpations: Abdomen is soft.     Tenderness: There is no abdominal tenderness.  Musculoskeletal:        General: No tenderness.     Left forearm: Edema (1+) present.     Cervical back: Normal range of motion and neck supple.     Right knee: No deformity or erythema. No tenderness.     Left knee: No deformity or erythema. No tenderness.     Right lower leg: 2+ Pitting Edema present.     Left lower leg: 2+ Pitting Edema present.       Legs:  Skin:    General: Skin is warm and dry.     Findings: No erythema or rash.  Neurological:     Mental Status: He is alert.     Comments: Patient is able to converse with me in spanish. Follows commands Moves all extremities.    ED Results and Treatments Labs (all labs ordered are listed, but only abnormal results are displayed) Labs Reviewed  CBC WITH DIFFERENTIAL/PLATELET - Abnormal; Notable for the following components:      Result  Value   RBC 3.78 (*)    Hemoglobin 11.8 (*)    HCT 34.2 (*)    Neutro Abs 8.1 (*)    Abs Immature Granulocytes 0.08 (*)    All other components within normal limits  COMPREHENSIVE METABOLIC PANEL - Abnormal; Notable for the following components:   Sodium 116 (*)    Chloride 83 (*)    Glucose, Bld 185 (*)    Calcium 7.7 (*)    Total Protein 6.1 (*)    Albumin 2.1 (*)    All other components within normal limits  URINALYSIS, ROUTINE W REFLEX MICROSCOPIC - Abnormal; Notable for the following components:   Color, Urine AMBER (*)    APPearance HAZY (*)    Glucose, UA 50 (*)    Ketones, ur 5 (*)    Protein, ur 30 (*)    All other components within normal limits  RESP PANEL BY RT-PCR (FLU A&B, COVID) ARPGX2  LACTIC ACID, PLASMA  LACTIC ACID, PLASMA  BRAIN NATRIURETIC PEPTIDE  SODIUM, URINE, RANDOM  OSMOLALITY  BASIC METABOLIC PANEL  OSMOLALITY, URINE  TROPONIN I (HIGH SENSITIVITY)  TROPONIN I (HIGH SENSITIVITY)                                                                                                                         EKG  EKG Interpretation  Date/Time:  Tuesday November 12 2020 03:37:34 EDT  Ventricular Rate:  74 PR Interval:  169 QRS Duration: 151 QT Interval:  447 QTC Calculation: 496 R Axis:   -2 Text Interpretation: Sinus rhythm Right bundle branch block No old tracing to compare Confirmed by Drema Pry [phone]) on 11/12/2020 3:56:05 AM       Radiology DG Chest 2 View  Result Date: 11/11/2020 CLINICAL DATA:  Shortness of breath EXAM: CHEST - 2 VIEW COMPARISON:  None. FINDINGS: Elevation of left diaphragm. Air distended bowel in the left upper quadrant. No consolidation, pleural effusion or pneumothorax. Normal cardiac size. IMPRESSION: Elevation of left diaphragm. Air distended bowel in the left upper quadrant which may be evaluated with dedicated abdominal radiograph Electronically Signed   By: Jasmine Pang M.D.   On: 11/11/2020 20:14   CT Angio Chest PE W  and/or Wo Contrast  Result Date: 11/12/2020 CLINICAL DATA:  Fever and diarrhea EXAM: CT ANGIOGRAPHY CHEST CT ABDOMEN AND PELVIS WITH CONTRAST TECHNIQUE: Multidetector CT imaging of the chest was performed using the standard protocol during bolus administration of intravenous contrast. Multiplanar CT image reconstructions and MIPs were obtained to evaluate the vascular anatomy. Multidetector CT imaging of the abdomen and pelvis was performed using the standard protocol during bolus administration of intravenous contrast. CONTRAST:  OMNIPAQUE IOHEXOL 350 MG/ML SOLN COMPARISON:  None. FINDINGS: CTA CHEST FINDINGS Cardiovascular: Normal heart size. No pericardial effusion. No acute aortic finding. No pulmonary artery filling defect Mediastinum/Nodes: Negative for mediastinal adenopathy or mass. Left axillary adenopathy associated with the following musculoskeletal findings. Lungs/Pleura: Bands of opacity in the bilateral lungs consistent with atelectasis. There is no edema, consolidation, effusion, or pneumothorax. Musculoskeletal: Large partially covered subcutaneous gas and fluid collection posterior to the left shoulder, insinuating along the musculature. Dimensions are at least 9 x 5 cm. No evidence of underlying bony erosion. Generalized spondylosis with scoliosis. Review of the MIP images confirms the above findings. Case discussed with Dr. Eudelia Bunch. [place] and autistic with limited history. CT ABDOMEN and PELVIS FINDINGS Hepatobiliary: No focal liver abnormality.Cholecystectomy. Pancreas: Unremarkable. Spleen: Unremarkable. Adrenals/Urinary Tract: Negative adrenals. No hydronephrosis or stone. Moderately distended bladder. Stomach/Bowel: No obstruction. Multiple segments of gas distended colon but no generalized obstructive pattern. Vascular/Lymphatic: No acute vascular abnormality. No mass or adenopathy. Reproductive:Enlarged prostate with central gland projecting into the  bladder base. Other: No ascites or pneumoperitoneum. Musculoskeletal: Spondylosis.  No acute or focal finding. Review of the MIP images confirms the above findings. IMPRESSION: Chest CTA: 1. Large, partially covered gas and fluid collection about the left shoulder, presumably abscess. 2. Negative for pulmonary embolism. Abdominal CT: 1. No acute finding. 2. Mild anasarca. 3. Enlarged prostate with moderately distended bladder. Electronically Signed   By: Marnee Spring M.D.   On: 11/12/2020 05:21   CT ABDOMEN PELVIS W CONTRAST  Result Date: 11/12/2020 CLINICAL DATA:  Fever and diarrhea EXAM: CT ANGIOGRAPHY CHEST CT ABDOMEN AND PELVIS WITH CONTRAST TECHNIQUE: Multidetector CT imaging of the chest was performed using the standard protocol during bolus administration of intravenous contrast. Multiplanar CT image reconstructions and MIPs were obtained to evaluate the vascular anatomy. Multidetector CT imaging of the abdomen and pelvis was performed using the standard protocol during bolus administration of intravenous contrast. CONTRAST:  OMNIPAQUE IOHEXOL 350 MG/ML SOLN COMPARISON:  None. FINDINGS: CTA CHEST FINDINGS Cardiovascular: Normal heart size. No pericardial effusion. No acute aortic finding. No pulmonary artery filling defect Mediastinum/Nodes: Negative for mediastinal adenopathy or mass. Left axillary adenopathy associated with the following  musculoskeletal findings. Lungs/Pleura: Bands of opacity in the bilateral lungs consistent with atelectasis. There is no edema, consolidation, effusion, or pneumothorax. Musculoskeletal: Large partially covered subcutaneous gas and fluid collection posterior to the left shoulder, insinuating along the musculature. Dimensions are at least 9 x 5 cm. No evidence of underlying bony erosion. Generalized spondylosis with scoliosis. Review of the MIP images confirms the above findings. Case discussed with Dr. Eudelia Bunch. The [place] and  autistic with limited history. CT ABDOMEN and PELVIS FINDINGS Hepatobiliary: No focal liver abnormality.Cholecystectomy. Pancreas: Unremarkable. Spleen: Unremarkable. Adrenals/Urinary Tract: Negative adrenals. No hydronephrosis or stone. Moderately distended bladder. Stomach/Bowel: No obstruction. Multiple segments of gas distended colon but no generalized obstructive pattern. Vascular/Lymphatic: No acute vascular abnormality. No mass or adenopathy. Reproductive:Enlarged prostate with central gland projecting into the bladder base. Other: No ascites or pneumoperitoneum. Musculoskeletal: Spondylosis.  No acute or focal finding. Review of the MIP images confirms the above findings. IMPRESSION: Chest CTA: 1. Large, partially covered gas and fluid collection about the left shoulder, presumably abscess. 2. Negative for pulmonary embolism. Abdominal CT: 1. No acute finding. 2. Mild anasarca. 3. Enlarged prostate with moderately distended bladder. Electronically Signed   By: Marnee Spring M.D.   On: 11/12/2020 05:21   DG Knee Complete 4 Views Left  Result Date: 11/12/2020 CLINICAL DATA:  Swelling.  History of fall. EXAM: LEFT KNEE - COMPLETE 4+ VIEW COMPARISON:  No prior. FINDINGS: Degenerative changes left knee, most prominent about the medial compartment. No evidence of acute fracture or dislocation. No effusion noted. IMPRESSION: Degenerative changes left knee.  No acute bony or joint abnormality. Electronically Signed   By: Maisie Fus  Register   On: 11/12/2020 06:40   DG Knee Complete 4 Views Right  Result Date: 11/12/2020 CLINICAL DATA:  Swelling.  Prior history of fall. EXAM: RIGHT KNEE - COMPLETE 4+ VIEW COMPARISON:  No recent prior. FINDINGS: Degenerative change noted about the right knee. Degenerative changes most prominent about the medial compartment. No evidence of acute fracture or dislocation. No prominent effusion noted. Mild peripheral vascular calcification cannot be excluded. IMPRESSION:  Degenerative changes right knee. No evidence of acute fracture or dislocation. Electronically Signed   By: Maisie Fus  Register   On: 11/12/2020 06:39    Pertinent labs & imaging results that were available during my care of the patient were reviewed by me and considered in my medical decision making (see chart for details).  Medications Ordered in ED Medications  clindamycin (CLEOCIN) IVPB 600 mg (has no administration in time range)  acetaminophen (TYLENOL) tablet 650 mg (650 mg Oral Given 11/11/20 1945)  furosemide (LASIX) injection 20 mg (20 mg Intravenous Given 11/12/20 0407)  cefTRIAXone (ROCEPHIN) 1 g in sodium chloride 0.9 % 100 mL IVPB (0 g Intravenous Stopped 11/12/20 0455)  iohexol (OMNIPAQUE) 350 MG/ML injection 100 mL (100 mLs Intravenous Contrast Given 11/12/20 0502)  Procedures .1-3 Lead EKG Interpretation  Date/Time: 11/12/2020 6:35 AM Performed by: Nira Conn, MD Authorized by: Nira Conn, MD     Interpretation: normal     ECG rate:  92   ECG rate assessment: normal     Rhythm: sinus rhythm     Ectopy: none     Conduction: normal   .Critical Care  Date/Time: 11/12/2020 6:36 AM Performed by: Nira Conn, MD Authorized by: Nira Conn, MD   Critical care provider statement:    Critical care time (minutes):  45   Critical care was necessary to treat or prevent imminent or life-threatening deterioration of the following conditions:  Metabolic crisis   Critical care was time spent personally by me on the following activities:  Discussions with consultants, evaluation of patient's response to treatment, examination of patient, ordering and performing treatments and interventions, ordering and review of laboratory studies, ordering and review of radiographic studies, pulse oximetry, re-evaluation of patient's  condition, obtaining history from patient or surrogate and review of old charts  (including critical care time)  Medical Decision Making / ED Course I have reviewed the nursing notes for this encounter and the patient's prior records (if available in EHR or on provided paperwork).   Karon Heckendorn was evaluated in Emergency Department on 11/12/2020 for the symptoms described in the history of present illness. He was evaluated in the context of the global COVID-19 pandemic, which necessitated consideration that the patient might be at risk for infection with the SARS-CoV-2 virus that causes COVID-19. Institutional protocols and algorithms that pertain to the evaluation of patients at risk for COVID-19 are in a state of rapid change based on information released by regulatory bodies including the CDC and federal and state organizations. These policies and algorithms were followed during the patient's care in the ED.  Patient presented for peripheral edema and increased work of breathing. On lung exam patient has bibasilar Rales. Satting well on room air. Patient also noted to be febrile with a temperature of 100.8  Labs without leukocytosis or significant anemia. Metabolic panel with significant hyponatremia, hyperglycemia without evidence of DKA.   No renal insufficiency.  Hyponatremia is likely related to hypervolemia. BNP within normal limits. EKG notable for right bundle branch block without dysrhythmias or acute ischemic changes.  Troponin negative.  Chest x-ray without evidence of pneumonia, pulmonary edema or effusions.  It did reveal distended gaseous intestines elevating the left diaphragm.  Family reports the patient is still having bowel movements and is actually having very loose stools that started today.  No emesis.  No known sick contacts.  No coughing or congestion.  COVID was negative.  Patient started on empiric antibiotics for possible pneumonia or intra-abdominal  infection.  To better characterize lung findings and rule out PE, CTA PE study was obtained.  Additionally a CT of the abdomen obtained to evaluate viscus organs and assess for intra-abdominal infection.  CT chest and abdomen without evidence of PE, pneumonia of pulmonary edema.  No intra-abdominal infection.  Incidentally there is a notable left posterior shoulder abscess with gas.    On reexamination, he has a large left posterior abscess with small amount of purulence draining. No tenderness. Family and friends were unaware.   Antibiotic regimen expanded with clindamycin, vancomycin and Zosyn.  I discussed the case with Dr. Bedelia Person from general surgery who will see the patient for surgical management.  She will reach out to orthopedic surgery to determine whether they will be  involved.  Spoke with Dr. Antionette Charpyd for medicine admission. He request consult to ICU to determine whether hypertonic saline will be required. If so, he will need to go to the ICU. If not, he will be admitted to medicine.   Final Clinical Impression(s) / ED Diagnoses Final diagnoses:  Fall  Anasarca  Hyponatremia  Abscess of left shoulder      This chart was dictated using voice recognition software.  Despite best efforts to proofread,  errors can occur which can change the documentation meaning.    Nira Connardama, Blakelynn Scheeler Eduardo, MD 11/12/20 [phone]

## 2020-11-12 NOTE — Progress Notes (Signed)
Pharmacy Antibiotic Note  Christian Green is a 65 y.o. male admitted on 11/11/2020 with  shoulder abscess .  Pharmacy has been consulted for vancomycin and zosyn dosing.  Plan: Vanc 1750mg  x1 then 1500mg  IV q12h (eAUC 491, Cr of 0.62mg /dL) Zosyn IV (4 hour infusion). -Monitor renal function, clinical status, and antibiotic plan  Height: 5\' 7"  (170.2 cm) Weight: 88.9 kg (196 lb) IBW/kg (Calculated) : 66.1  Temp (24hrs), Avg:99.5 F (37.5 C), Min:98.1 F (36.7 C), Max:100.8 F (38.2 C)  Recent Labs  Lab 11/11/20 1943 11/12/20 0435 11/12/20 0821  WBC 10.3  --   --   CREATININE 0.70  --  0.62  LATICACIDVEN 1.2 1.2  --     Estimated Creatinine Clearance: 96.6 mL/min (by C-G formula based on SCr of 0.62 mg/dL).    No Known Allergies  Antimicrobials this admission: 7/19 Vanc >>  7/19 Zosyn >>  7/19 Clinda x 1 7/18 Ceftriaxone x1  Microbiology results: 7/19 BCx:   Thank you for allowing pharmacy to be a part of this patient's care.  8/19, PharmD, [facility] Emergency Medicine Clinical Pharmacist ED RPh Phone: [phone] Main RX: [phone]

## 2020-11-12 NOTE — Consult Note (Signed)
Reason for Consult:Left shoulder abscess Referring Physician: Drema Pry Time called: [id number] Time at bedside: 30   Christian Green is an 65 y.o. male.  HPI: Christian Green was brought to the ED for BLE edema and SOB. He is autistic and is noncommunicative. He is visiting from [place]. During workup he was noted to have an incidental finding of a left posterior shoulder abscess and orthopedic surgery was consulted. He is also quite hyponatremic and so is not cleared for operative I&D.  Past Medical History:  Diagnosis Date   Autism    Diabetes mellitus without complication (HCC)    Hypertension     History reviewed. No pertinent surgical history.  No family history on file.  Social History:  reports that he does not drink alcohol and does not use drugs. No history on file for tobacco use.  Allergies: No Known Allergies  Medications: I have reviewed the patient's current medications.  Results for orders placed or performed during the hospital encounter of 11/11/20 (from the past 48 hour(s))  Urinalysis, Routine w reflex microscopic Urine, Clean Catch     Status: Abnormal   Collection Time: 11/11/20  7:41 PM  Result Value Ref Range   Color, Urine AMBER (A) YELLOW    Comment: BIOCHEMICALS MAY BE AFFECTED BY COLOR   APPearance HAZY (A) CLEAR   Specific Gravity, Urine 1.025 1.005 - 1.030   pH 5.0 5.0 - 8.0   Glucose, UA 50 (A) NEGATIVE mg/dL   Hgb urine dipstick NEGATIVE NEGATIVE   Bilirubin Urine NEGATIVE NEGATIVE   Ketones, ur 5 (A) NEGATIVE mg/dL   Protein, ur 30 (A) NEGATIVE mg/dL   Nitrite NEGATIVE NEGATIVE   Leukocytes,Ua NEGATIVE NEGATIVE   RBC / HPF 0-5 0 - 5 RBC/hpf   WBC, UA 0-5 0 - 5 WBC/hpf   Bacteria, UA NONE SEEN NONE SEEN   Mucus PRESENT     Comment: Performed at [facility], [address] [place], [place] [address]  Sodium, urine, random     Status: None   Collection Time: 11/11/20  7:41 PM  Result Value Ref Range   Sodium, Ur <10 mmol/L    Comment:  Performed at [facility], [address] [place], [place] [address]  CBC with Differential     Status: Abnormal   Collection Time: 11/11/20  7:43 PM  Result Value Ref Range   WBC 10.3 4.0 - 10.5 K/uL   RBC 3.78 (L) 4.22 - 5.81 MIL/uL   Hemoglobin 11.8 (L) 13.0 - 17.0 g/dL   HCT 32.9 (L) 51.8 - 84.1 %   MCV 90.5 80.0 - 100.0 fL   MCH 31.2 26.0 - 34.0 pg   MCHC 34.5 30.0 - 36.0 g/dL   RDW 66.0 63.0 - 16.0 %   Platelets 252 150 - 400 K/uL   nRBC 0.0 0.0 - 0.2 %   Neutrophils Relative % 79 %   Neutro Abs 8.1 (H) 1.7 - 7.7 K/uL   Lymphocytes Relative 10 %   Lymphs Abs 1.0 0.7 - 4.0 K/uL   Monocytes Relative 10 %   Monocytes Absolute 1.0 0.1 - 1.0 K/uL   Eosinophils Relative 0 %   Eosinophils Absolute 0.0 0.0 - 0.5 K/uL   Basophils Relative 0 %   Basophils Absolute 0.0 0.0 - 0.1 K/uL   Immature Granulocytes 1 %   Abs Immature Granulocytes 0.08 (H) 0.00 - 0.07 K/uL    Comment: Performed at [facility], [address]  [place], [place] [address]  Comprehensive metabolic panel     Status: Abnormal   Collection Time: 11/11/20  7:43 PM  Result Value Ref Range   Sodium 116 (LL) 135 - 145 mmol/L    Comment: CRITICAL RESULT CALLED TO, READ BACK BY AND VERIFIED WITH: FERRAINOLO J,RN 11/11/20 2126 WAYK    Potassium 3.8 3.5 - 5.1 mmol/L   Chloride 83 (L) 98 - 111 mmol/L   CO2 24 22 - 32 mmol/L   Glucose, Bld 185 (H) 70 - 99 mg/dL    Comment: Glucose reference range applies only to samples taken after fasting for at least 8 hours.   BUN 9 8 - 23 mg/dL   Creatinine, Ser 4.09 0.61 - 1.24 mg/dL   Calcium 7.7 (L) 8.9 - 10.3 mg/dL   Total Protein 6.1 (L) 6.5 - 8.1 g/dL   Albumin 2.1 (L) 3.5 - 5.0 g/dL   AST 41 15 - 41 U/L   ALT 32 0 - 44 U/L   Alkaline Phosphatase 74 38 - 126 U/L   Total Bilirubin 0.7 0.3 - 1.2 mg/dL   GFR, Estimated >81 >19 mL/min    Comment: (NOTE) Calculated using the CKD-EPI Creatinine Equation (2021)    Anion gap 9 5 - 15    Comment: Performed at  [facility], [address], [place], [place] [address]  Lactic acid, plasma     Status: None   Collection Time: 11/11/20  7:43 PM  Result Value Ref Range   Lactic Acid, Venous 1.2 0.5 - 1.9 mmol/L    Comment: Performed at [facility], [address] [place], [place] [address]  Brain natriuretic peptide     Status: None   Collection Time: 11/11/20  7:43 PM  Result Value Ref Range   B Natriuretic Peptide 47.4 0.0 - 100.0 pg/mL    Comment: Performed at [facility], [address], [place], [place] [address]  Resp Panel by RT-PCR (Flu A&B, Covid) Nasopharyngeal Swab     Status: None   Collection Time: 11/11/20  7:45 PM   Specimen: Nasopharyngeal Swab; Nasopharyngeal(NP) swabs in vial transport medium  Result Value Ref Range   SARS Coronavirus 2 by RT PCR NEGATIVE NEGATIVE    Comment: (NOTE) SARS-CoV-2 target nucleic acids are NOT DETECTED.  The SARS-CoV-2 RNA is generally detectable in upper respiratory specimens during the acute phase of infection. The lowest concentration of SARS-CoV-2 viral copies this assay can detect is 138 copies/mL. A negative result does not preclude SARS-Cov-2 infection and should not be used as the sole basis for treatment or other patient management decisions. A negative result may occur with  improper specimen collection/handling, submission of specimen other than nasopharyngeal swab, presence of viral mutation(s) within the areas targeted by this assay, and inadequate number of viral copies(<138 copies/mL). A negative result must be combined with clinical observations, patient history, and epidemiological information. The expected result is Negative.  Fact Sheet for Patients:  BloggerCourse.com  Fact Sheet for Healthcare Providers:  SeriousBroker.it  This test is no t yet approved or cleared by the [place] FDA and  has been authorized for detection and/or diagnosis of  SARS-CoV-2 by FDA under an Emergency Use Authorization (EUA). This EUA will remain  in effect (meaning this test can be used) for the duration of the COVID-19 declaration under Section 564(b)(1) of the Act, 21 U.S.C.section 360bbb-3(b)(1), unless the authorization is terminated  or revoked sooner.       Influenza  A by PCR NEGATIVE NEGATIVE   Influenza B by PCR NEGATIVE NEGATIVE    Comment: (NOTE) The Xpert Xpress SARS-CoV-2/FLU/RSV plus assay is intended as an aid in the diagnosis of influenza from Nasopharyngeal swab specimens and should not be used as a sole basis for treatment. Nasal washings and aspirates are unacceptable for Xpert Xpress SARS-CoV-2/FLU/RSV testing.  Fact Sheet for Patients: BloggerCourse.comhttps://www.fda.gov/media/152166/download  Fact Sheet for Healthcare Providers: SeriousBroker.ithttps://www.fda.gov/media/152162/download  This test is not yet approved or cleared by the [place] FDA and has been authorized for detection and/or diagnosis of SARS-CoV-2 by FDA under an Emergency Use Authorization (EUA). This EUA will remain in effect (meaning this test can be used) for the duration of the COVID-19 declaration under Section 564(b)(1) of the Act, 21 U.S.C. section 360bbb-3(b)(1), unless the authorization is terminated or revoked.  Performed at [facility], [address] [place], [place] [address]   Troponin I (High Sensitivity)     Status: None   Collection Time: 11/12/20  3:27 AM  Result Value Ref Range   Troponin I (High Sensitivity) 7 <18 ng/L    Comment: (NOTE) Elevated high sensitivity troponin I (hsTnI) values and significant  changes across serial measurements may suggest ACS but many other  chronic and acute conditions are known to elevate hsTnI results.  Refer to the "Links" section for chest pain algorithms and additional  guidance. Performed at [facility], [address] [place], [place] [address]   Lactic acid, plasma     Status: None    Collection Time: 11/12/20  4:35 AM  Result Value Ref Range   Lactic Acid, Venous 1.2 0.5 - 1.9 mmol/L    Comment: Performed at [facility], [address] [place], [place] [address]    DG Chest 2 View  Result Date: 11/11/2020 CLINICAL DATA:  Shortness of breath EXAM: CHEST - 2 VIEW COMPARISON:  None. FINDINGS: Elevation of left diaphragm. Air distended bowel in the left upper quadrant. No consolidation, pleural effusion or pneumothorax. Normal cardiac size. IMPRESSION: Elevation of left diaphragm. Air distended bowel in the left upper quadrant which may be evaluated with dedicated abdominal radiograph Electronically Signed   By: Jasmine PangKim  Fujinaga M.D.   On: 11/11/2020 20:14   CT Head Wo Contrast  Result Date: 11/12/2020 CLINICAL DATA:  Hyponatremia EXAM: CT HEAD WITHOUT CONTRAST TECHNIQUE: Contiguous axial images were obtained from the base of the skull through the vertex without intravenous contrast. COMPARISON:  None. FINDINGS: Brain: No evidence of acute infarction, hemorrhage, hydrocephalus, extra-axial collection or mass lesion/mass effect. Mild atrophic changes and chronic white matter ischemic changes are noted. Vascular: No hyperdense vessel or unexpected calcification. Skull: Normal. Negative for fracture or focal lesion. Sinuses/Orbits: No acute finding. Other: None. IMPRESSION: Chronic atrophic and ischemic changes without acute abnormality. Electronically Signed   By: Alcide CleverMark  Lukens M.D.   On: 11/12/2020 08:27   CT Angio Chest PE W and/or Wo Contrast  Result Date: 11/12/2020 CLINICAL DATA:  Fever and diarrhea EXAM: CT ANGIOGRAPHY CHEST CT ABDOMEN AND PELVIS WITH CONTRAST TECHNIQUE: Multidetector CT imaging of the chest was performed using the standard protocol during bolus administration of intravenous contrast. Multiplanar CT image reconstructions and MIPs were obtained to evaluate the vascular anatomy. Multidetector CT imaging of the abdomen and pelvis was performed using the standard  protocol during bolus administration of intravenous contrast. CONTRAST:  100mL OMNIPAQUE IOHEXOL 350 MG/ML SOLN COMPARISON:  None. FINDINGS: CTA CHEST FINDINGS Cardiovascular: Normal heart size. No pericardial  effusion. No acute aortic finding. No pulmonary artery filling defect Mediastinum/Nodes: Negative for mediastinal adenopathy or mass. Left axillary adenopathy associated with the following musculoskeletal findings. Lungs/Pleura: Bands of opacity in the bilateral lungs consistent with atelectasis. There is no edema, consolidation, effusion, or pneumothorax. Musculoskeletal: Large partially covered subcutaneous gas and fluid collection posterior to the left shoulder, insinuating along the musculature. Dimensions are at least 9 x 5 cm. No evidence of underlying bony erosion. Generalized spondylosis with scoliosis. Review of the MIP images confirms the above findings. Case discussed with Dr. Eudelia Bunch. [place] and autistic with limited history. CT ABDOMEN and PELVIS FINDINGS Hepatobiliary: No focal liver abnormality.Cholecystectomy. Pancreas: Unremarkable. Spleen: Unremarkable. Adrenals/Urinary Tract: Negative adrenals. No hydronephrosis or stone. Moderately distended bladder. Stomach/Bowel: No obstruction. Multiple segments of gas distended colon but no generalized obstructive pattern. Vascular/Lymphatic: No acute vascular abnormality. No mass or adenopathy. Reproductive:Enlarged prostate with central gland projecting into the bladder base. Other: No ascites or pneumoperitoneum. Musculoskeletal: Spondylosis.  No acute or focal finding. Review of the MIP images confirms the above findings. IMPRESSION: Chest CTA: 1. Large, partially covered gas and fluid collection about the left shoulder, presumably abscess. 2. Negative for pulmonary embolism. Abdominal CT: 1. No acute finding. 2. Mild anasarca. 3. Enlarged prostate with moderately distended bladder. Electronically Signed   By:  Marnee Spring M.D.   On: 11/12/2020 05:21   CT ABDOMEN PELVIS W CONTRAST  Result Date: 11/12/2020 CLINICAL DATA:  Fever and diarrhea EXAM: CT ANGIOGRAPHY CHEST CT ABDOMEN AND PELVIS WITH CONTRAST TECHNIQUE: Multidetector CT imaging of the chest was performed using the standard protocol during bolus administration of intravenous contrast. Multiplanar CT image reconstructions and MIPs were obtained to evaluate the vascular anatomy. Multidetector CT imaging of the abdomen and pelvis was performed using the standard protocol during bolus administration of intravenous contrast. CONTRAST:  OMNIPAQUE IOHEXOL 350 MG/ML SOLN COMPARISON:  None. FINDINGS: CTA CHEST FINDINGS Cardiovascular: Normal heart size. No pericardial effusion. No acute aortic finding. No pulmonary artery filling defect Mediastinum/Nodes: Negative for mediastinal adenopathy or mass. Left axillary adenopathy associated with the following musculoskeletal findings. Lungs/Pleura: Bands of opacity in the bilateral lungs consistent with atelectasis. There is no edema, consolidation, effusion, or pneumothorax. Musculoskeletal: Large partially covered subcutaneous gas and fluid collection posterior to the left shoulder, insinuating along the musculature. Dimensions are at least 9 x 5 cm. No evidence of underlying bony erosion. Generalized spondylosis with scoliosis. Review of the MIP images confirms the above findings. Case discussed with Dr. Eudelia Bunch. [place] and autistic with limited history. CT ABDOMEN and PELVIS FINDINGS Hepatobiliary: No focal liver abnormality.Cholecystectomy. Pancreas: Unremarkable. Spleen: Unremarkable. Adrenals/Urinary Tract: Negative adrenals. No hydronephrosis or stone. Moderately distended bladder. Stomach/Bowel: No obstruction. Multiple segments of gas distended colon but no generalized obstructive pattern. Vascular/Lymphatic: No acute vascular abnormality. No mass or adenopathy.  Reproductive:Enlarged prostate with central gland projecting into the bladder base. Other: No ascites or pneumoperitoneum. Musculoskeletal: Spondylosis.  No acute or focal finding. Review of the MIP images confirms the above findings. IMPRESSION: Chest CTA: 1. Large, partially covered gas and fluid collection about the left shoulder, presumably abscess. 2. Negative for pulmonary embolism. Abdominal CT: 1. No acute finding. 2. Mild anasarca. 3. Enlarged prostate with moderately distended bladder. Electronically Signed   By: Marnee Spring M.D.   On: 11/12/2020 05:21   CT Shoulder Left Wo Contrast  Result Date: 11/12/2020 CLINICAL DATA:  Shoulder abscess. EXAM: CT  OF THE UPPER LEFT EXTREMITY WITHOUT CONTRAST TECHNIQUE: Multidetector CT imaging of the left shoulder was performed according to the standard protocol. COMPARISON:  None. FINDINGS: Bones/Joint/Cartilage No bony destruction or periosteal reaction. No fracture or dislocation. Mild degenerative changes of the acromioclavicular and glenohumeral joints. No joint effusion. Ligaments Ligaments are suboptimally evaluated by CT. Muscles and Tendons Grossly intact.  No muscle atrophy. Soft tissue Superficial large 6.2 x 14.3 x 8.3 cm gas and fluid collection overlying the posterior shoulder. Reactive left axillary lymph nodes. No soft tissue mass. Mild linear scarring/atelectasis in the left lung. Calcified granuloma in the lingula. IMPRESSION: 1. Superficial 6.2 x 14.3 x 8.3 cm abscess overlying the posterior shoulder. 2. No acute osseous abnormality. Electronically Signed   By: Obie Dredge M.D.   On: 11/12/2020 08:13   DG Knee Complete 4 Views Left  Result Date: 11/12/2020 CLINICAL DATA:  Swelling.  History of fall. EXAM: LEFT KNEE - COMPLETE 4+ VIEW COMPARISON:  No prior. FINDINGS: Degenerative changes left knee, most prominent about the medial compartment. No evidence of acute fracture or dislocation. No effusion noted. IMPRESSION: Degenerative  changes left knee.  No acute bony or joint abnormality. Electronically Signed   By: Maisie Fus  Register   On: 11/12/2020 06:40   DG Knee Complete 4 Views Right  Result Date: 11/12/2020 CLINICAL DATA:  Swelling.  Prior history of fall. EXAM: RIGHT KNEE - COMPLETE 4+ VIEW COMPARISON:  No recent prior. FINDINGS: Degenerative change noted about the right knee. Degenerative changes most prominent about the medial compartment. No evidence of acute fracture or dislocation. No prominent effusion noted. Mild peripheral vascular calcification cannot be excluded. IMPRESSION: Degenerative changes right knee. No evidence of acute fracture or dislocation. Electronically Signed   By: Maisie Fus  Register   On: 11/12/2020 06:39    Review of Systems  Unable to perform ROS: Patient nonverbal  Blood pressure 114/70, pulse 91, temperature 98.1 F (36.7 C), temperature source Oral, resp. rate 20, weight 88.9 kg, SpO2 98 %. Physical Exam Constitutional:      General: He is not in acute distress.    Appearance: He is well-developed. He is not diaphoretic.  HENT:     Head: Normocephalic and atraumatic.  Eyes:     General: No scleral icterus.       Right eye: No discharge.        Left eye: No discharge.     Conjunctiva/sclera: Conjunctivae normal.  Cardiovascular:     Rate and Rhythm: Normal rate and regular rhythm.  Pulmonary:     Effort: Pulmonary effort is normal. No respiratory distress.  Musculoskeletal:     Cervical back: Normal range of motion.     Comments: Left shoulder, elbow, wrist, digits- Fluctuant posterior shoulder, apparently nontender, no instability, no blocks to motion  Sens  Ax/R/M/U could not assess  Mot   Ax/ R/ PIN/ M/ AIN/ U intact  Rad 2+  Skin:    General: Skin is warm and dry.  Neurological:     Mental Status: He is alert.  Psychiatric:        Mood and Affect: Mood normal.        Speech: He is noncommunicative.        Behavior: Behavior normal.    Assessment/Plan: Left shoulder  abscess -- Will attempt bedside I&D, favor less optimal washout now vs more thorough one in 1-2d once sodium corrected. May need both.    Freeman Caldron, PA-C Orthopedic Surgery [phone] 11/12/2020, 8:51 AM

## 2020-11-13 ENCOUNTER — Inpatient Hospital Stay (HOSPITAL_COMMUNITY): Payer: Self-pay

## 2020-11-13 DIAGNOSIS — R9431 Abnormal electrocardiogram [ECG] [EKG]: Secondary | ICD-10-CM

## 2020-11-13 LAB — CBC
HCT: 33.1 % — ABNORMAL LOW (ref 39.0–52.0)
Hemoglobin: 11.3 g/dL — ABNORMAL LOW (ref 13.0–17.0)
MCH: 30.6 pg (ref 26.0–34.0)
MCHC: 34.1 g/dL (ref 30.0–36.0)
MCV: 89.7 fL (ref 80.0–100.0)
Platelets: 234 10*3/uL (ref 150–400)
RBC: 3.69 MIL/uL — ABNORMAL LOW (ref 4.22–5.81)
RDW: 12.1 % (ref 11.5–15.5)
WBC: 5.8 10*3/uL (ref 4.0–10.5)
nRBC: 0 % (ref 0.0–0.2)

## 2020-11-13 LAB — BASIC METABOLIC PANEL
Anion gap: 9 (ref 5–15)
BUN: 9 mg/dL (ref 8–23)
CO2: 27 mmol/L (ref 22–32)
Calcium: 7.9 mg/dL — ABNORMAL LOW (ref 8.9–10.3)
Chloride: 92 mmol/L — ABNORMAL LOW (ref 98–111)
Creatinine, Ser: 0.56 mg/dL — ABNORMAL LOW (ref 0.61–1.24)
GFR, Estimated: >60 mL/min (ref >60)
Glucose, Bld: 153 mg/dL — ABNORMAL HIGH (ref 70–99)
Potassium: 3.4 mmol/L — ABNORMAL LOW (ref 3.5–5.1)
Sodium: 128 mmol/L — ABNORMAL LOW (ref 135–145)

## 2020-11-13 LAB — SODIUM, URINE, RANDOM: Sodium, Ur: 27 mmol/L

## 2020-11-13 LAB — GLUCOSE, CAPILLARY
Glucose-Capillary: 155 mg/dL — ABNORMAL HIGH (ref 70–99)
Glucose-Capillary: 222 mg/dL — ABNORMAL HIGH (ref 70–99)
Glucose-Capillary: 225 mg/dL — ABNORMAL HIGH (ref 70–99)
Glucose-Capillary: 218 mg/dL — ABNORMAL HIGH (ref 70–99)

## 2020-11-13 LAB — ECHOCARDIOGRAM LIMITED
Area-P 1/2: 8.25 cm2
Height: 67 in
Weight: 2684.32 oz

## 2020-11-13 LAB — HIV ANTIBODY (ROUTINE TESTING W REFLEX): HIV Screen 4th Generation wRfx: NONREACTIVE

## 2020-11-13 LAB — OSMOLALITY, URINE: Osmolality, Ur: 753 mOsm/kg (ref 300–900)

## 2020-11-13 LAB — CORTISOL-AM, BLOOD: Cortisol - AM: 15 ug/dL (ref 6.7–22.6)

## 2020-11-13 MED ORDER — FUROSEMIDE 40 MG PO TABS
40.0000 mg | ORAL_TABLET | Freq: Every day | ORAL | Status: DC
  Administered 2020-11-13 – 2020-11-16 (×4): 40 mg via ORAL
  Filled 2020-11-13 (×4): qty 1

## 2020-11-13 MED ORDER — PERFLUTREN LIPID MICROSPHERE
1.0000 mL | INTRAVENOUS | Status: AC | PRN
  Administered 2020-11-13: 2 mL via INTRAVENOUS
  Filled 2020-11-13: qty 10

## 2020-11-13 NOTE — Plan of Care (Signed)
  Problem: Clinical Measurements: Goal: Diagnostic test results will improve Outcome: Progressing   Problem: Activity: Goal: Risk for activity intolerance will decrease Outcome: Progressing   Problem: Pain Managment: Goal: General experience of comfort will improve Outcome: Progressing   Problem: Safety: Goal: Ability to remain free from injury will improve Outcome: Progressing   Problem: Skin Integrity: Goal: Risk for impaired skin integrity will decrease Outcome: Progressing   

## 2020-11-13 NOTE — Progress Notes (Signed)
PROGRESS NOTE   Christian Green  OHY:[id number] DOB: 1955/09/23 DOA: 11/11/2020 PCP: Pcp, No  Brief Narrative:   65 year old male visiting from [place] Known history of autism hypertension Developed 1 week bilateral lower extremity edema + less talkative more fatigue Also noticed high blood sugars in the 200s Came to the emergency room found to have a T-max 100.8, hypotensive 90 range initially white count 10 sodium 116 BNP 47  CT abdomen pelvis showed anasarca enlarged prostate with glucose and ketones present On work-up of shortness of breath etc. he was found to have a left posterior shoulder abscess Orthopedics consulted performed a dedicated CT shoulder showing 6.2X 14 X8.3 abscess over left shoulder and I&D was performed Infectious disease consulted recommended Ancef    Hospital-Problem based course  Acute hypervolemic hyponatremia?  Initial sodium 116 secondary to anasarca liver disease versus heart failure--- initial thought hypotonic hyponatremia -Initial osmolality 266 raising concern for hypotonic hypovolemic hyponatremia -Given Lasix with good effect --could be a mixed picture? 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  Shoulder abscess - Status post I&D 7/19 growing gram-positive cocci- -Follow blood culture X2 from 7/19 and narrow as appropriate  DM TY 2 A1c 9 this admission - Implement metformin this admission later on continue sliding scale for now - Blood sugars ranging 1 55-2 22 -Will need significant teaching about diabetes and sister has been informed about this  Prostate enlargement -Continue Flomax 0.4  started this admission    DVT prophylaxis:  Code Status:  Family Communication:  Disposition:  Status is: Inpatient  Remains inpatient appropriate because:Hemodynamically unstable and Unsafe d/c plan  Dispo: The patient is from: Home              Anticipated d/c is to: Home              Patient currently is not medically stable to d/c.   Difficult to place patient No       Consultants:  Orthopedics ] Infectious disease telephone consulted  Procedures: I&D and copious irrigation at bedside 7/19  Antimicrobials: Ancef 7/19   Subjective:  Per sister patient seems much more improved in terms of mental state Patient interviewed with interpreter He is responding to simple questions He has no chest pain no fever no chills He does state that he feels a little swollen It is difficult to get a history from him given his underlying autism  Objective: Vitals:   11/12/20 2138 11/13/20 0025 11/13/20 0459 11/13/20 0759  BP: 118/67 (!) 102/57 97/60 125/73  Pulse: 80 67 60 73  Resp: 18 18 18 18   Temp: (!) 97.5 F (36.4 C) (!) 97.5 F (36.4 C) 98 F (36.7 C) 97.7 F (36.5 C)  TempSrc: Oral Axillary    SpO2: 97% 98% 98% 98%  Weight:   76.1 kg   Height:        Intake/Output Summary (Last 24 hours) at 11/13/2020 1136 Last data filed at 11/13/2020 0935 Gross per 24 hour  Intake 682.87 ml  Output 1200 ml  Net -517.13 ml   Filed Weights   11/12/20 0711 11/13/20 0459  Weight: 88.9 kg 76.1 kg    Examination:  EOMI NCAT bruised  her nose no icterus no pallor No JVD S1-S2 no murmur Chest relatively clear Abdomen distended slightly tense cannot appreciate liver or spleen-unclear if shifting dullness my exam seems equivocal No tremors no asterixis Trace lower extremity edema   Data Reviewed: personally reviewed   CBC    Component Value Date/Time   WBC 5.8 11/13/2020 0539   RBC 3.69 (L) 11/13/2020 0539   HGB 11.3 (L) 11/13/2020 0539   HCT 33.1 (L) 11/13/2020 0539    PLT 234 11/13/2020 0539   MCV 89.7 11/13/2020 0539   MCH 30.6 11/13/2020 0539   MCHC 34.1 11/13/2020 0539   RDW 12.1 11/13/2020 0539   LYMPHSABS 1.0 11/11/2020 1943   MONOABS 1.0 11/11/2020 1943   EOSABS 0.0 11/11/2020 1943   BASOSABS 0.0 11/11/2020 1943   CMP Latest Ref Rng & Units 11/13/2020 11/12/2020 11/12/2020  Glucose 70 - 99 mg/dL 502(D) 741(O) 878(M)  BUN 8 - 23 mg/dL 9 8 9   Creatinine 0.61 - 1.24 mg/dL ) 7.67(M 0.94  Sodium 135 - 145 mmol/L 128(L) 125(L) 122(L)  Potassium 3.5 - 5.1 mmol/L 3.4(L) 3.6 3.7  Chloride 98 - 111 mmol/L 92(L) 90(L) 88(L)  CO2 22 - 32 mmol/L 27 26 27   Calcium 8.9 - 10.3 mg/dL 7.9(L) 7.9(L) 7.9(L)  Total Protein 6.5 - 8.1 g/dL - - -  Total Bilirubin 0.3 - 1.2 mg/dL - - -  Alkaline Phos 38 - 126 U/L - - -  AST 15 - 41 U/L - - -  ALT 0 - 44 U/L - - -     Radiology Studies: DG Chest 2 View  Result Date: 11/11/2020 CLINICAL DATA:  Shortness of breath EXAM: CHEST - 2 VIEW COMPARISON:  None. FINDINGS: Elevation of left diaphragm. Air distended bowel in the left upper quadrant. No consolidation, pleural effusion or pneumothorax. Normal cardiac size. IMPRESSION: Elevation of left diaphragm. Air distended bowel in the left upper quadrant which may be evaluated with dedicated abdominal radiograph Electronically Signed   By: M.D.   On: 11/11/2020 20:14   CT Head Wo Contrast  Result Date: 11/12/2020 CLINICAL DATA:  Hyponatremia EXAM: CT HEAD WITHOUT CONTRAST TECHNIQUE: Contiguous axial images were obtained from the base of the skull through the vertex without intravenous contrast. COMPARISON:  None. FINDINGS: Brain: No evidence of acute infarction, hemorrhage, hydrocephalus, extra-axial collection or mass lesion/mass effect. Mild atrophic changes and chronic white matter ischemic changes are noted. Vascular: No hyperdense vessel or unexpected calcification. Skull: Normal. Negative for fracture or focal lesion. Sinuses/Orbits: No acute finding.  Other: None. IMPRESSION: Chronic atrophic and ischemic changes without acute abnormality. Electronically Signed   By: 11/13/2020 M.D.   On: 11/12/2020 08:27   CT Angio Chest PE W and/or Wo Contrast  Result Date: 11/12/2020 CLINICAL DATA:  Fever and diarrhea EXAM: CT ANGIOGRAPHY CHEST CT ABDOMEN AND PELVIS WITH CONTRAST TECHNIQUE: Multidetector CT imaging of the chest was performed using the standard protocol during bolus administration of intravenous contrast. Multiplanar CT image reconstructions and MIPs were obtained to evaluate the vascular anatomy. Multidetector CT imaging of the abdomen and pelvis was performed using the standard protocol during bolus administration of intravenous contrast. CONTRAST:  11/14/2020 OMNIPAQUE IOHEXOL 350 MG/ML SOLN COMPARISON:  None. FINDINGS: CTA CHEST FINDINGS Cardiovascular: Normal heart size. No pericardial effusion. No acute aortic finding. No pulmonary artery filling defect Mediastinum/Nodes: Negative for mediastinal adenopathy or mass. Left axillary adenopathy associated with the following musculoskeletal findings. Lungs/Pleura: Bands of opacity in the bilateral lungs consistent  with atelectasis. There is no edema, consolidation, effusion, or pneumothorax. Musculoskeletal: Large partially covered subcutaneous gas and fluid collection posterior to the left shoulder, insinuating along the musculature. Dimensions are at least 9 x 5 cm. No evidence of underlying bony erosion. Generalized spondylosis with scoliosis. Review of the MIP images confirms the above findings. Case discussed with Dr. Eudelia Bunchardama. [place] and autistic with limited history. CT ABDOMEN and PELVIS FINDINGS Hepatobiliary: No focal liver abnormality.Cholecystectomy. Pancreas: Unremarkable. Spleen: Unremarkable. Adrenals/Urinary Tract: Negative adrenals. No hydronephrosis or stone. Moderately distended bladder. Stomach/Bowel: No obstruction. Multiple segments of gas distended colon  but no generalized obstructive pattern. Vascular/Lymphatic: No acute vascular abnormality. No mass or adenopathy. Reproductive:Enlarged prostate with central gland projecting into the bladder base. Other: No ascites or pneumoperitoneum. Musculoskeletal: Spondylosis.  No acute or focal finding. Review of the MIP images confirms the above findings. IMPRESSION: Chest CTA: 1. Large, partially covered gas and fluid collection about the left shoulder, presumably abscess. 2. Negative for pulmonary embolism. Abdominal CT: 1. No acute finding. 2. Mild anasarca. 3. Enlarged prostate with moderately distended bladder. Electronically Signed   By: Marnee SpringJonathon  Watts M.D.   On: 11/12/2020 05:21   CT ABDOMEN PELVIS W CONTRAST  Result Date: 11/12/2020 CLINICAL DATA:  Fever and diarrhea EXAM: CT ANGIOGRAPHY CHEST CT ABDOMEN AND PELVIS WITH CONTRAST TECHNIQUE: Multidetector CT imaging of the chest was performed using the standard protocol during bolus administration of intravenous contrast. Multiplanar CT image reconstructions and MIPs were obtained to evaluate the vascular anatomy. Multidetector CT imaging of the abdomen and pelvis was performed using the standard protocol during bolus administration of intravenous contrast. CONTRAST:  100mL OMNIPAQUE IOHEXOL 350 MG/ML SOLN COMPARISON:  None. FINDINGS: CTA CHEST FINDINGS Cardiovascular: Normal heart size. No pericardial effusion. No acute aortic finding. No pulmonary artery filling defect Mediastinum/Nodes: Negative for mediastinal adenopathy or mass. Left axillary adenopathy associated with the following musculoskeletal findings. Lungs/Pleura: Bands of opacity in the bilateral lungs consistent with atelectasis. There is no edema, consolidation, effusion, or pneumothorax. Musculoskeletal: Large partially covered subcutaneous gas and fluid collection posterior to the left shoulder, insinuating along the musculature. Dimensions are at least 9 x 5 cm. No evidence of underlying bony  erosion. Generalized spondylosis with scoliosis. Review of the MIP images confirms the above findings. Case discussed with Dr. Eudelia Bunchardama. [place] and autistic with limited history. CT ABDOMEN and PELVIS FINDINGS Hepatobiliary: No focal liver abnormality.Cholecystectomy. Pancreas: Unremarkable. Spleen: Unremarkable. Adrenals/Urinary Tract: Negative adrenals. No hydronephrosis or stone. Moderately distended bladder. Stomach/Bowel: No obstruction. Multiple segments of gas distended colon but no generalized obstructive pattern. Vascular/Lymphatic: No acute vascular abnormality. No mass or adenopathy. Reproductive:Enlarged prostate with central gland projecting into the bladder base. Other: No ascites or pneumoperitoneum. Musculoskeletal: Spondylosis.  No acute or focal finding. Review of the MIP images confirms the above findings. IMPRESSION: Chest CTA: 1. Large, partially covered gas and fluid collection about the left shoulder, presumably abscess. 2. Negative for pulmonary embolism. Abdominal CT: 1. No acute finding. 2. Mild anasarca. 3. Enlarged prostate with moderately distended bladder. Electronically Signed   By: Marnee SpringJonathon  Watts M.D.   On: 11/12/2020 05:21   CT Shoulder Left Wo Contrast  Result Date: 11/12/2020 CLINICAL DATA:  Shoulder abscess. EXAM: CT OF THE UPPER LEFT EXTREMITY WITHOUT CONTRAST TECHNIQUE: Multidetector CT imaging of the left shoulder was performed according to the standard protocol. COMPARISON:  None. FINDINGS: Bones/Joint/Cartilage No bony destruction or periosteal reaction. No fracture  or dislocation. Mild degenerative changes of the acromioclavicular and glenohumeral joints. No joint effusion. Ligaments Ligaments are suboptimally evaluated by CT. Muscles and Tendons Grossly intact.  No muscle atrophy. Soft tissue Superficial large 6.2 x 14.3 x 8.3 cm gas and fluid collection overlying the posterior shoulder. Reactive left axillary lymph nodes. No soft tissue  mass. Mild linear scarring/atelectasis in the left lung. Calcified granuloma in the lingula. IMPRESSION: 1. Superficial 6.2 x 14.3 x 8.3 cm abscess overlying the posterior shoulder. 2. No acute osseous abnormality. Electronically Signed   By: Obie Dredge M.D.   On: 11/12/2020 08:13   DG Knee Complete 4 Views Left  Result Date: 11/12/2020 CLINICAL DATA:  Swelling.  History of fall. EXAM: LEFT KNEE - COMPLETE 4+ VIEW COMPARISON:  No prior. FINDINGS: Degenerative changes left knee, most prominent about the medial compartment. No evidence of acute fracture or dislocation. No effusion noted. IMPRESSION: Degenerative changes left knee.  No acute bony or joint abnormality. Electronically Signed   By: Maisie Fus  Register   On: 11/12/2020 06:40   DG Knee Complete 4 Views Right  Result Date: 11/12/2020 CLINICAL DATA:  Swelling.  Prior history of fall. EXAM: RIGHT KNEE - COMPLETE 4+ VIEW COMPARISON:  No recent prior. FINDINGS: Degenerative change noted about the right knee. Degenerative changes most prominent about the medial compartment. No evidence of acute fracture or dislocation. No prominent effusion noted. Mild peripheral vascular calcification cannot be excluded. IMPRESSION: Degenerative changes right knee. No evidence of acute fracture or dislocation. Electronically Signed   By: Maisie Fus  Register   On: 11/12/2020 06:39     Scheduled Meds:  enoxaparin (LOVENOX) injection  40 mg Subcutaneous Q24H   feeding supplement (GLUCERNA SHAKE)  237 mL Oral TID BM   insulin aspart  0-6 Units Subcutaneous TID WC   sodium chloride flush  3 mL Intravenous Q12H   Continuous Infusions:   ceFAZolin (ANCEF) IV Stopped (11/13/20 0553)     LOS: 1 day   Time spent: 15 minutes including care coordination time  Rhetta Mura, MD [facility] To contact the attending provider between 7A-7P or the covering provider during after hours 7P-7A, please log into the web site [email or web address] and access using  universal [place] password for that web site. If you do not have the password, please call the hospital operator.  11/13/2020, 11:36 AM

## 2020-11-13 NOTE — Progress Notes (Signed)
Nutrition Brief Note  Albumin is not an indicator of nutritional status, but rather an indicator of morbidity and mortality, and recovery from acute and chronic illness.   Albumin has a half-life of 21 days and is strongly affected by stress response and inflammatory process, therefore, do not expect to see an improvement in this lab value during acute hospitalization.   When a patient presents with low albumin, it is likely skewed due to the acute inflammatory response. Unless it is suspected that patient had poor PO intake or malnutrition prior to admission, then RD should not be consulted solely for low albumin.   Note that low albumin is no longer used to diagnose malnutrition. [place] uses the new malnutrition guidelines published by the American Society for Parenteral and Enteral Nutrition (A.S.P.E.N.) and the Academy of Nutrition and Dietetics (AND).   No nutrition intervention warranted at this time. If nutrition concerns arise, please re-consult RD.  Vertell Limber, RD, LDN (she/her/hers) Registered Dietitian I After-Hours/Weekend Pager # in [place]

## 2020-11-13 NOTE — Progress Notes (Signed)
  Echocardiogram 2D Echocardiogram has been performed.  Christian Green 11/13/2020, 2:22 PM

## 2020-11-13 NOTE — Progress Notes (Signed)
Pt voided at shift change for sample to be sent down.  Christian Green, that speaks [place] and brought him to the hospital, said he became less and less interactive the last 10 days. He is listed as the only contact on pt's chart.  Changed dressing on left shoulder d/t dressing soaked in serosanguinous fluid. Sister watched and had interpreter on to answer questions. Moist to dry dressing with kerlix, NS, 4x4 gauze, ABD and tape. Pt tolerated well.  Pt's family is from [place] and they are to fly back to [place] next week out of [place]. They need to drive from here to [place] on Sunday. The plan is for the whole family to fly back together, so they are trying to figure out when to switch their flight to so he can go back with them.  They are currently staying in a hotel nearby.

## 2020-11-14 LAB — COMPREHENSIVE METABOLIC PANEL
ALT: 25 U/L (ref 0–44)
AST: 23 U/L (ref 15–41)
Albumin: 2 g/dL — ABNORMAL LOW (ref 3.5–5.0)
Alkaline Phosphatase: 74 U/L (ref 38–126)
Anion gap: 8 (ref 5–15)
BUN: 10 mg/dL (ref 8–23)
CO2: 30 mmol/L (ref 22–32)
Calcium: 8.2 mg/dL — ABNORMAL LOW (ref 8.9–10.3)
Chloride: 91 mmol/L — ABNORMAL LOW (ref 98–111)
Creatinine, Ser: 0.62 mg/dL (ref 0.61–1.24)
GFR, Estimated: >60 mL/min (ref >60)
Glucose, Bld: 178 mg/dL — ABNORMAL HIGH (ref 70–99)
Potassium: 3.5 mmol/L (ref 3.5–5.1)
Sodium: 129 mmol/L — ABNORMAL LOW (ref 135–145)
Total Bilirubin: 0.5 mg/dL (ref 0.3–1.2)
Total Protein: 5.9 g/dL — ABNORMAL LOW (ref 6.5–8.1)

## 2020-11-14 LAB — RENAL FUNCTION PANEL
Albumin: 2.1 g/dL — ABNORMAL LOW (ref 3.5–5.0)
Anion gap: 9 (ref 5–15)
BUN: 10 mg/dL (ref 8–23)
CO2: 25 mmol/L (ref 22–32)
Calcium: 8.2 mg/dL — ABNORMAL LOW (ref 8.9–10.3)
Chloride: 94 mmol/L — ABNORMAL LOW (ref 98–111)
Creatinine, Ser: 0.73 mg/dL (ref 0.61–1.24)
GFR, Estimated: >60 mL/min (ref >60)
Glucose, Bld: 234 mg/dL — ABNORMAL HIGH (ref 70–99)
Phosphorus: 3.5 mg/dL (ref 2.5–4.6)
Potassium: 3.8 mmol/L (ref 3.5–5.1)
Sodium: 128 mmol/L — ABNORMAL LOW (ref 135–145)

## 2020-11-14 LAB — CBC WITH DIFFERENTIAL/PLATELET
Abs Immature Granulocytes: 0.04 10*3/uL (ref 0.00–0.07)
Basophils Absolute: 0 10*3/uL (ref 0.0–0.1)
Basophils Relative: 1 %
Eosinophils Absolute: 0.1 10*3/uL (ref 0.0–0.5)
Eosinophils Relative: 3 %
HCT: 34.7 % — ABNORMAL LOW (ref 39.0–52.0)
Hemoglobin: 11.8 g/dL — ABNORMAL LOW (ref 13.0–17.0)
Immature Granulocytes: 1 %
Lymphocytes Relative: 24 %
Lymphs Abs: 1.3 10*3/uL (ref 0.7–4.0)
MCH: 30.6 pg (ref 26.0–34.0)
MCHC: 34 g/dL (ref 30.0–36.0)
MCV: 90.1 fL (ref 80.0–100.0)
Monocytes Absolute: 0.5 10*3/uL (ref 0.1–1.0)
Monocytes Relative: 9 %
Neutro Abs: 3.3 10*3/uL (ref 1.7–7.7)
Neutrophils Relative %: 62 %
Platelets: 295 10*3/uL (ref 150–400)
RBC: 3.85 MIL/uL — ABNORMAL LOW (ref 4.22–5.81)
RDW: 12.1 % (ref 11.5–15.5)
WBC: 5.2 10*3/uL (ref 4.0–10.5)
nRBC: 0 % (ref 0.0–0.2)

## 2020-11-14 LAB — GLUCOSE, CAPILLARY
Glucose-Capillary: 186 mg/dL — ABNORMAL HIGH (ref 70–99)
Glucose-Capillary: 302 mg/dL — ABNORMAL HIGH (ref 70–99)
Glucose-Capillary: 225 mg/dL — ABNORMAL HIGH (ref 70–99)
Glucose-Capillary: 145 mg/dL — ABNORMAL HIGH (ref 70–99)

## 2020-11-14 LAB — PROTIME-INR
INR: 1.1 (ref 0.8–1.2)
Prothrombin Time: 14.5 seconds (ref 11.4–15.2)

## 2020-11-14 MED ORDER — LIVING WELL WITH DIABETES BOOK
Freq: Once | Status: DC
  Filled 2020-11-14: qty 1

## 2020-11-14 MED ORDER — METFORMIN HCL 500 MG PO TABS
500.0000 mg | ORAL_TABLET | Freq: Two times a day (BID) | ORAL | Status: DC
  Administered 2020-11-14 – 2020-11-16 (×4): 500 mg via ORAL
  Filled 2020-11-14 (×4): qty 1

## 2020-11-14 MED ORDER — SPIRONOLACTONE 25 MG PO TABS
50.0000 mg | ORAL_TABLET | Freq: Every day | ORAL | Status: DC
  Administered 2020-11-14 – 2020-11-16 (×3): 50 mg via ORAL
  Filled 2020-11-14 (×3): qty 2

## 2020-11-14 MED ORDER — INSULIN ASPART PROT & ASPART (70-30 MIX) 100 UNIT/ML ~~LOC~~ SUSP
4.0000 [IU] | Freq: Two times a day (BID) | SUBCUTANEOUS | Status: DC
  Administered 2020-11-14 – 2020-11-16 (×4): 4 [IU] via SUBCUTANEOUS
  Filled 2020-11-14: qty 10

## 2020-11-14 MED ORDER — GLIMEPIRIDE 1 MG PO TABS
1.0000 mg | ORAL_TABLET | Freq: Every day | ORAL | Status: DC
  Administered 2020-11-15 – 2020-11-16 (×2): 1 mg via ORAL
  Filled 2020-11-14 (×2): qty 1

## 2020-11-14 NOTE — Consult Note (Addendum)
WOC Nurse Consult Note: Patient receiving care in [facility] [phone] Reason for Consult: wick left shoulder incision Wound type: Surgical wound left posterior shoulder.  Pressure Injury POA: NA Measurement: 0.2 x 2.5 x 2.5 Wound bed: red Drainage (amount, consistency, odor) serous  Periwound: intact Dressing procedure/placement/frequency: Placed a small piece of Aquacel Advantage into the wound to allow for absorption and the advantages of the antimicrobial silver properties that is in this dressing.  Place a small piece of Aquacel Advantage [place] # [id number]) in the wound and cover with 4 x 4s and Medipore tape. Change daily.  Monitor the wound area(s) for worsening of condition such as: Signs/symptoms of infection, increase in size, development of or worsening of odor, development of pain, or increased pain at the affected locations.   Notify the medical team if any of these develop.  Thank you for the consult. WOC nurse will not follow at this time.   Please re-consult the WOC team if needed.  Renaldo Reel Katrinka Blazing, MSN, RN, CMSRN, Angus Seller, [facility] Wound Treatment Associate Pager [phone]

## 2020-11-14 NOTE — Progress Notes (Signed)
Inpatient Diabetes Program Recommendations  AACE/ADA: New Consensus Statement on Inpatient Glycemic Control (2015)  Target Ranges:  Prepandial:   less than 140 mg/dL      Peak postprandial:   less than 180 mg/dL (1-2 hours)      Critically ill patients:  140 - 180 mg/dL   Lab Results  Component Value Date   GLUCAP 302 (H) 11/14/2020   HGBA1C 9.1 (H) 11/12/2020    Review of Glycemic Control  Diabetes history: new onset DM Outpatient Diabetes medications: none Current orders for Inpatient glycemic control: Novolog 70/30 4 units BID, Amaryl 1 mg QD, Metformin 500 mg BID, Novolog 0-6 units TID  Inpatient Diabetes Program Recommendations:    Spoke with patient and sister at length regarding new onset DM using interpreter. The sister is patient's caretaker and reports that she has been trying to limit CHO including sodas, teas, and sugary items in diet.  Reviewed patient's current A1c of 9.1%. Explained what a A1c is and what it measures. Also reviewed goal A1c with patient, importance of good glucose control @ home, and blood sugar goals. Reviewed patho of DM, role of pancreas, deficiency in insulin, need for insulin, oral agents, survival skills to include interventions, vascular changes, impact of infection and other commorbidities.  Patient will need a meter on discharge. Encouraged to begin practicing injections and checking CBGs while inpatient. Discussed Relion products to include 70/30, when to take, ensure with meals, timing of injections, target goals for CBGs, when to call MD and how to check cbgs.  LWWDM in spanish ordered. Dietitian ordered.  Educated patient and sister on insulin pen use at home. Reviewed contents of insulin flexpen starter kit. Reviewed all steps if insulin pen including attachment of needle, 2-unit air shot, dialing up dose, giving injection, removing needle, disposal of sharps, storage of unused insulin, disposal of insulin etc. Patient unable to provide  successful return demonstration Will NEED further reinforcement. Also reviewed troubleshooting with insulin pen. MD to give patient Rxs for insulin pens and insulin pen needles. Will have DM coordinator see again on 7/22.  Thanks, Bronson Curb, MSN, RNC-OB Diabetes Coordinator [phone] (8a-5p)

## 2020-11-14 NOTE — Progress Notes (Signed)
Patient ID: Christian Green, male   DOB: [birth year], 65 y.o.   MRN: [id number]   LOS: 2 days   Subjective: Seems to be doing better.   Objective: Vital signs in last 24 hours: Temp:  [97.6 F (36.4 C)-98.1 F (36.7 C)] 97.9 F (36.6 C) (07/21 0821) Pulse Rate:  [74-99] 99 (07/21 0821) Resp:  [18-20] 19 (07/21 0821) BP: (131-151)/(77-89) 146/89 (07/21 0821) SpO2:  [96 %-98 %] 96 % (07/21 0821) Weight:  [76.1 kg] 76.1 kg (07/21 0506) Last BM Date: 11/12/20   Laboratory  CBC Recent Labs    11/13/20 0539 11/14/20 0104  WBC 5.8 5.2  HGB 11.3* 11.8*  HCT 33.1* 34.7*  PLT 234 295   BMET Recent Labs    11/13/20 0539 11/14/20 0104  NA 128* 129*  K 3.4* 3.5  CL 92* 91*  CO2 27 30  GLUCOSE 153* 178*  BUN 9 10  CREATININE 0.56* 0.62  CALCIUM 7.9* 8.2*     Physical Exam General appearance: alert and no distress Left shoulder: Serous discharge from wound. Packing removed.   Assessment/Plan: Left shoulder abscess -- Will consult WOC for wound care. Would like to keep wick in until wound fills in a bit more. F/u with Dr. Everardo Pacific in 1-2 weeks.    Freeman Caldron, PA-C Orthopedic Surgery [phone] 11/14/2020

## 2020-11-14 NOTE — TOC Initial Note (Addendum)
Transition of Care [facility]) - Initial/Assessment Note    Patient Details  Name: Christian Green MRN: [id number] Date of Birth: February 10, 1956  Transition of Care [facility]) CM/SW Contact:    Kingsley Plan, RN Phone Number: 11/14/2020, 11:53 AM  Clinical Narrative:                 Spoke to patient and sister via status interpreter Vernona Rieger [phone].   Patient and sister here visiting, they have tickets to return to  [place] next week. Sister stated a doctor told her Aydyn will be in hospital until Monday or Tuesday. She is going to call "the company" they got their tickets through to see if they stay longer than they originally planned how much extra they will be charge. They plan to return to [place] shortly after discharge.    NCM will help with prescriptions at discharge. Explained will ask MD to send to [facility] pharmacy and will assist with MATCH. They have $3 co pay per prescription.   [facility] are a month or more out for new patient appointments , they are not planning to stay that long.   Patient has a PCP in [place] , however sister stated the state rotates PCP's so she is not sure who will be his PCP when they return , but she will call ASAP.   Per notes nursing has already shown sister how to change dressing. NCM has asked nursing to continue teaching wound care , diabetes education ( including insulin) , and CHF teaching. In progression charge nurse was going to order diabetes consult.   NCM has asked nursing to provide some dressing supplies at discharge also.   [facility]. Sister stated she does not know the address or the name of hotel.   Sister voiced understanding of all of above.   NCM entered consults for dietitian and diabetes coordinator  Expected Discharge Plan: Home/Self Care     Patient Goals and CMS Choice        Expected Discharge Plan and Services Expected Discharge Plan: Home/Self Care   Discharge Planning Services: CM Consult   Living  arrangements for the past 2 months: Single Family Home                                      Prior Living Arrangements/Services Living arrangements for the past 2 months: Single Family Home Lives with:: Siblings                   Activities of Daily Living Home Assistive Devices/Equipment: None ADL Screening (condition at time of admission) Patient's cognitive ability adequate to safely complete daily activities?: Yes Is the patient deaf or have difficulty hearing?: Yes Does the patient have difficulty seeing, even when wearing glasses/contacts?: No Does the patient have difficulty concentrating, remembering, or making decisions?: Yes Patient able to express need for assistance with ADLs?: Yes Does the patient have difficulty dressing or bathing?: Yes Independently performs ADLs?: No Communication: Needs assistance Is this a change from baseline?: Pre-admission baseline Dressing (OT): Needs assistance Is this a change from baseline?: Change from baseline, expected to last <3days Grooming: Needs assistance Is this a change from baseline?: Change from baseline, expected to last <3 days Feeding: Needs assistance Is this a change from baseline?: Pre-admission baseline Bathing: Needs assistance Is this a change from baseline?: Pre-admission baseline Toileting: Needs assistance Is this  a change from baseline?: Pre-admission baseline In/Out Bed: Needs assistance Is this a change from baseline?: Pre-admission baseline Walks in Home: Needs assistance Is this a change from baseline?: Pre-admission baseline Does the patient have difficulty walking or climbing stairs?: Yes Weakness of Legs: Both Weakness of Arms/Hands: None  Permission Sought/Granted   Permission granted to share information with : Yes, Verbal Permission Granted  Share Information with NAME: sister           Emotional Assessment              Admission diagnosis:  Anasarca [R60.1] Hyponatremia  [E87.1] Fall [W19.XXXA] Abscess of left shoulder [L02.414] Patient Active Problem List   Diagnosis Date Noted   Hyponatremia 11/12/2020   Acute metabolic encephalopathy 11/12/2020   Autism 11/12/2020   Right bundle branch block 11/12/2020   Severe protein-calorie malnutrition Lily Kocher: less than 60% of standard weight) (HCC) 11/12/2020   Anasarca 11/12/2020   Abscess of left shoulder 11/12/2020   Diabetes mellitus type 2 in obese (HCC) 11/12/2020   BPH (benign prostatic hyperplasia) 11/12/2020   Normocytic anemia 11/12/2020   PCP:  Pcp, No Pharmacy:   [facility] [address] [place] [address] Phone: [phone] Fax: [phone]     Social Determinants of Health (SDOH) Interventions    Readmission Risk Interventions No flowsheet data found.

## 2020-11-14 NOTE — Progress Notes (Signed)
PROGRESS NOTE   Christian LanceJorge Green  UJW:[id number] DOB: 06/28/1955 DOA: 11/11/2020 PCP: Pcp, No  Brief Narrative:   65 year old male visiting from [place] Known history of autism hypertension Developed 1 week bilateral lower extremity edema + less talkative more fatigue Also noticed high blood sugars in the 200s Came to the emergency room found to have a T-max 100.8, hypotensive 90 range initially white count 10 sodium 116 BNP 47  CT abdomen pelvis showed anasarca enlarged prostate with glucose and ketones present On work-up of shortness of breath etc. he was found to have a left posterior shoulder abscess Orthopedics consulted performed a dedicated CT shoulder showing 6.2X 14 X8.3 abscess over left shoulder and I&D was performed Infectious disease consulted recommended Ancef    Hospital-Problem based course  Acute hypervolemic hyponatremia-secondary to anasarca liver disease versus heart failure--could be a mixed picture? 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  Shoulder abscess - Status post I&D 7/19 growing gram-positive cocci- -Follow blood culture X2 from 7/19 [ngtd x 48 h] -wound cult few GRm + cocci only -continue ancef--narrow in am to po keflex for total 10 days therapy  DM TY 2 A1c 9 this admission - Patient ideally should be on insulin according to guidelines -Implement metformin 500 bid, start amaryl 1 mg q am, and will need insulin teaching-Long discussion with sister who was uncomfortable giving the same  also patient is Spanish-speaking and she does not understand completely how to dial up the insulin but has neighbors in [place] who can - We will use oral meds and discuss again in a.m. 722 if she wants to learn how to use 70/30 insulin and we can prescribe this on discharge if she is amenable to the same - Blood sugars ranging 186-302 -Diabetic coordinator has been engaged to help teach the patient about diabetes and to teach the sister regarding the same  Prostate enlargement -Continue Flomax 0.4 started this admission    DVT prophylaxis:  Code Status:  Family Communication:  Disposition:  Status is: Inpatient  Remains inpatient appropriate because:Hemodynamically unstable and Unsafe d/c plan  Dispo: The patient is from: Home              Anticipated d/c is to: Home              Patient currently is not medically stable to d/c.   Difficult to place patient No       Consultants:  Orthopedics Infectious disease telephone consulted  Procedures: I&D and copious irrigation at bedside 7/19  Antimicrobials: Ancef 7/19   Subjective:  Seems more engaging today Interviewed with interpreter Sister at bedside 20-minute discussion about steatohepatitis diabetes send the link between them and diet-she seems to understand with interpreter's input Also discussed significant education regarding salt restriction and triglyceride restriction in terms of trying to treat her brother's issues with diet We finally also talked about diabetes and the need for insulin and her comfort level with the same Patient himself somewhat interactive but his autism limits the same   Objective: Vitals:   11/14/20  0025 11/14/20 0506 11/14/20 0821 11/14/20 1151  BP: 140/84 (!) 151/77 (!) 146/89 (!) 162/93  Pulse: 74 78 99 (!) 105  Resp: 18 18 19 19   Temp: 98 F (36.7 C) 98 F (36.7 C) 97.9 F (36.6 C) 97.8 F (36.6 C)  TempSrc: Oral Oral Oral Oral  SpO2: 97% 96% 96% 95%  Weight:  76.1 kg     Height:        Intake/Output Summary (Last 24 hours) at 11/14/2020 1437 Last data filed at 11/14/2020 0509 Gross per 24 hour  Intake 240 ml  Output 600 ml  Net -360 ml    Filed Weights   11/12/20 0711 11/13/20 0459 11/14/20 0506  Weight: 88.9 kg 76.1 kg 76.1 kg    Examination:  EOMI NCAT no focal deficit bruise on nose Neck soft supple Chest clear Abdomen soft cannot appreciate shifting dullness,?  Tip of liver felt, cannot appreciate spleen Trace lower extremity edema No JVD Neurologically intact moving all 4 limbs-no tremor   Data Reviewed: personally reviewed   CBC    Component Value Date/Time   WBC 5.2 11/14/2020 0104   RBC 3.85 (L) 11/14/2020 0104   HGB 11.8 (L) 11/14/2020 0104   HCT 34.7 (L) 11/14/2020 0104   PLT 295 11/14/2020 0104   MCV 90.1 11/14/2020 0104   MCH 30.6 11/14/2020 0104   MCHC 34.0 11/14/2020 0104   RDW 12.1 11/14/2020 0104   LYMPHSABS 1.3 11/14/2020 0104   MONOABS 0.5 11/14/2020 0104   EOSABS 0.1 11/14/2020 0104   BASOSABS 0.0 11/14/2020 0104   CMP Latest Ref Rng & Units 11/14/2020 11/13/2020 11/12/2020  Glucose 70 - 99 mg/dL 11/14/2020) 297(L) 892(J)  BUN 8 - 23 mg/dL 10 9 8   Creatinine 0.61 - 1.24 mg/dL 194(R ) 7.40  Sodium 135 - 145 mmol/L 129(L) 128(L) 125(L)  Potassium 3.5 - 5.1 mmol/L 3.5 3.4(L) 3.6  Chloride 98 - 111 mmol/L 91(L) 92(L) 90(L)  CO2 22 - 32 mmol/L 30 27 26   Calcium 8.9 - 10.3 mg/dL 8.2(L) 7.9(L) 7.9(L)  Total Protein 6.5 - 8.1 g/dL 5.9(L) - -  Total Bilirubin 0.3 - 1.2 mg/dL 0.5 - -  Alkaline Phos 38 - 126 U/L 74 - -  AST 15 - 41 U/L 23 - -  ALT 0 - 44 U/L 25 - -     Radiology Studies: ECHOCARDIOGRAM LIMITED  Result Date: 11/13/2020    ECHOCARDIOGRAM LIMITED REPORT   Patient Name:   Christian Green Date of Exam: 11/13/2020 Medical Rec #:  11/15/2020   Height:       67.0 in Accession #:    Christian Green  Weight:       167.8 lb Date of Birth:  01-12-1956    BSA:          1.877 m Patient Age:    65 years    BP:            145/85 mmHg Patient Gender: M           HR:           93 bpm. Exam Location:  Inpatient Procedure: Limited Echo, Cardiac Doppler, Color Doppler and Intracardiac            Opacification Agent Indications:    Abnormal EKG  History:        Patient has no prior history of Echocardiogram examinations.                 Arrythmias:RBBB, Signs/Symptoms:LE edema; Risk  Factors:Hypertension and Diabetes. Autism-non verbal.  Sonographer:    Lavenia Atlas Referring Phys: [id number] RONDELL A SMITH  Sonographer Comments: Technically difficult study due to poor echo windows, suboptimal parasternal window and suboptimal apical window. Non verbal patient. IMPRESSIONS  1. LV outflow gradient, peak 26 mmHg. Left ventricular ejection fraction, by estimation, is 65 to 70%. The left ventricle has hyperdynamic function. The left ventricle has no regional wall motion abnormalities. Left ventricular diastolic parameters are consistent with Grade I diastolic dysfunction (impaired relaxation).  2. Right ventricular systolic function was not well visualized. The right ventricular size is not well visualized. Tricuspid regurgitation signal is inadequate for assessing PA pressure.  3. The mitral valve was not well visualized.  4. The aortic valve was not well visualized.  5. Very limited images technically. The right heart was not visualized. FINDINGS  Left Ventricle: LV outflow gradient, peak 26 mmHg. Left ventricular ejection fraction, by estimation, is 65 to 70%. The left ventricle has hyperdynamic function. The left ventricle has no regional wall motion abnormalities. Definity contrast agent was given IV to delineate the left ventricular endocardial borders. Left ventricular diastolic parameters are consistent with Grade I diastolic dysfunction (impaired relaxation). Right Ventricle: The right ventricular size is not well visualized. Right ventricular systolic function was not well visualized. Tricuspid regurgitation signal  is inadequate for assessing PA pressure. Left Atrium: Left atrial size was normal in size. Right Atrium: Right atrial size was not well visualized. Mitral Valve: The mitral valve was not well visualized. Aortic Valve: The aortic valve was not well visualized. Pulmonic Valve: The pulmonic valve was not well visualized. Aorta: The aortic root was not well visualized. Venous: The inferior vena cava was not well visualized.  Diastology LV e' medial:    6.53 cm/s LV E/e' medial:  9.1 LV e' lateral:   7.77 cm/s LV E/e' lateral: 7.7  LEFT ATRIUM           Index LA Vol (A4C): 26.5 ml 14.12 ml/m  AORTIC VALVE LVOT Vmax:   118.00 cm/s LVOT Vmean:  86.200 cm/s LVOT VTI:    0.196 m MITRAL VALVE MV Area (PHT): 8.25 cm    SHUNTS MV Decel Time: 92 msec     Systemic VTI: 0.20 m MV E velocity: 59.60 cm/s MV A velocity: 99.50 cm/s MV E/A ratio:  0.60 Marca Ancona MD Electronically signed by Marca Ancona MD Signature Date/Time: 11/13/2020/5:26:30 PM    Final    US Abdomen Limited RUQ (LIVER/GB)  Result Date: 11/13/2020 CLINICAL DATA:  Evaluate for possible cirrhotic change EXAM: ULTRASOUND ABDOMEN LIMITED RIGHT UPPER QUADRANT COMPARISON:  CT from the previous day. FINDINGS: Gallbladder: Surgically removed Common bile duct: Diameter: 8.8 mm. Liver: Mild heterogeneity of the liver is noted without focal mass. Generalized increased echogenicity is noted. Portal vein is patent on color Doppler imaging with normal direction of blood flow towards the liver. Other: None. IMPRESSION: Echogenicity changes in the liver suggestive of fatty infiltration or early cirrhosis. No discrete mass is noted. Electronically Signed   By: Alcide Clever M.D.   On: 11/13/2020 15:40     Scheduled Meds:  enoxaparin (LOVENOX) injection  40 mg Subcutaneous Q24H   feeding supplement (GLUCERNA SHAKE)  237 mL Oral TID BM   furosemide  40 mg Oral Daily   insulin aspart  0-6 Units Subcutaneous TID WC   sodium chloride flush  3 mL Intravenous Q12H    Continuous Infusions:   ceFAZolin (ANCEF) IV 2 g (11/14/20 1352)  LOS: 2 days   Time spent: 45 minutes including care coordination  Rhetta Mura, MD [facility] To contact the attending provider between 7A-7P or the covering provider during after hours 7P-7A, please log into the web site [email or web address] and access using universal [place] password for that web site. If you do not have the password, please call the hospital operator.  11/14/2020, 2:37 PM

## 2020-11-15 ENCOUNTER — Other Ambulatory Visit (HOSPITAL_COMMUNITY): Payer: Self-pay

## 2020-11-15 LAB — GLUCOSE, CAPILLARY
Glucose-Capillary: 171 mg/dL — ABNORMAL HIGH (ref 70–99)
Glucose-Capillary: 126 mg/dL — ABNORMAL HIGH (ref 70–99)
Glucose-Capillary: 166 mg/dL — ABNORMAL HIGH (ref 70–99)
Glucose-Capillary: 138 mg/dL — ABNORMAL HIGH (ref 70–99)

## 2020-11-15 LAB — COMPREHENSIVE METABOLIC PANEL
ALT: 23 U/L (ref 0–44)
AST: 31 U/L (ref 15–41)
Albumin: 2.1 g/dL — ABNORMAL LOW (ref 3.5–5.0)
Alkaline Phosphatase: 60 U/L (ref 38–126)
Anion gap: 6 (ref 5–15)
BUN: 10 mg/dL (ref 8–23)
CO2: 31 mmol/L (ref 22–32)
Calcium: 8.4 mg/dL — ABNORMAL LOW (ref 8.9–10.3)
Chloride: 95 mmol/L — ABNORMAL LOW (ref 98–111)
Creatinine, Ser: 0.67 mg/dL (ref 0.61–1.24)
GFR, Estimated: >60 mL/min (ref >60)
Glucose, Bld: 157 mg/dL — ABNORMAL HIGH (ref 70–99)
Potassium: 3.6 mmol/L (ref 3.5–5.1)
Sodium: 132 mmol/L — ABNORMAL LOW (ref 135–145)
Total Bilirubin: 0.4 mg/dL (ref 0.3–1.2)
Total Protein: 5.9 g/dL — ABNORMAL LOW (ref 6.5–8.1)

## 2020-11-15 LAB — AEROBIC CULTURE W GRAM STAIN (SUPERFICIAL SPECIMEN): Culture: NO GROWTH

## 2020-11-15 MED ORDER — INSULIN ISOPHANE HUMAN 100 UNIT/ML KWIKPEN
8.0000 [IU] | PEN_INJECTOR | Freq: Every day | SUBCUTANEOUS | 11 refills | Status: AC
  Filled 2020-11-15: qty 3, 30d supply, fill #0

## 2020-11-15 MED ORDER — PENTIPS 32G X 4 MM MISC
1.0000 | Freq: Once | 0 refills | Status: AC
  Filled 2020-11-15: qty 100, 30d supply, fill #0

## 2020-11-15 MED ORDER — GLIMEPIRIDE 2 MG PO TABS
1.0000 mg | ORAL_TABLET | Freq: Every day | ORAL | 0 refills | Status: AC
  Filled 2020-11-15: qty 15, 30d supply, fill #0

## 2020-11-15 MED ORDER — CEPHALEXIN 500 MG PO CAPS
500.0000 mg | ORAL_CAPSULE | Freq: Three times a day (TID) | ORAL | Status: DC
  Administered 2020-11-15 – 2020-11-16 (×4): 500 mg via ORAL
  Filled 2020-11-15 (×4): qty 1

## 2020-11-15 MED ORDER — CEPHALEXIN 500 MG PO CAPS
500.0000 mg | ORAL_CAPSULE | Freq: Three times a day (TID) | ORAL | 0 refills | Status: AC
  Filled 2020-11-15: qty 30, 10d supply, fill #0

## 2020-11-15 MED ORDER — METFORMIN HCL 500 MG PO TABS
500.0000 mg | ORAL_TABLET | Freq: Two times a day (BID) | ORAL | 0 refills | Status: DC
  Filled 2020-11-15: qty 60, 30d supply, fill #0

## 2020-11-15 MED ORDER — ENALAPRIL MALEATE 10 MG PO TABS
10.0000 mg | ORAL_TABLET | Freq: Two times a day (BID) | ORAL | 0 refills | Status: DC
  Filled 2020-11-15: qty 60, 30d supply, fill #0

## 2020-11-15 MED ORDER — INSULIN STARTER KIT- PEN NEEDLES (SPANISH)
1.0000 | Freq: Once | Status: DC
  Filled 2020-11-15: qty 1

## 2020-11-15 MED ORDER — FUROSEMIDE 40 MG PO TABS
40.0000 mg | ORAL_TABLET | Freq: Every day | ORAL | 0 refills | Status: AC
  Filled 2020-11-15: qty 30, 30d supply, fill #0

## 2020-11-15 MED ORDER — LIVING WELL WITH DIABETES BOOK - IN SPANISH
Freq: Once | Status: DC
  Filled 2020-11-15: qty 1

## 2020-11-15 MED ORDER — SPIRONOLACTONE 50 MG PO TABS
50.0000 mg | ORAL_TABLET | Freq: Every day | ORAL | 0 refills | Status: AC
  Filled 2020-11-15: qty 30, 30d supply, fill #0

## 2020-11-15 NOTE — Progress Notes (Signed)
Inpatient Diabetes Program Recommendations  AACE/ADA: New Consensus Statement on Inpatient Glycemic Control (2015)  Target Ranges:  Prepandial:   less than 140 mg/dL      Peak postprandial:   less than 180 mg/dL (1-2 hours)      Critically ill patients:  140 - 180 mg/dL   Lab Results  Component Value Date   GLUCAP 126 (H) 11/15/2020   HGBA1C 9.1 (H) 11/12/2020    Review of Glycemic Control   Spoke with pt and sister at bedside regarding how to check blood sugar and how to give insulin via insulin pen.  Gave meter from office as they have limited money going back to their country.  Walked through glucose checks using the meter I provided from the office.  Sister to perform CBGs and insulin injections for pt.  Dietitian joined me in the room for education.  Thanks,  Christena Deem RN, MSN, BC-ADM Inpatient Diabetes Coordinator Team Pager [phone] (8a-5p)

## 2020-11-15 NOTE — Care Management (Signed)
Prescriptions were sent to [facility]. TOC Pharmacy will fill and keep in main pharmacy. If patient discharges over the weekend, nurse will need to pick up medications in main pharmacy

## 2020-11-15 NOTE — Progress Notes (Addendum)
PROGRESS NOTE   Christian Green  ENM:[id number] DOB: May 13, 1955 DOA: 11/11/2020 PCP: Pcp, No  Brief Narrative:   65 year old male visiting from [place] history of autism hypertension Developed 1 week bilateral lower extremity edema + less talkative more fatigue Also noticed high blood sugars in the 200s Came to the emergency room found to have a T-max 100.8, hypotensive 90 range initially white count 10 sodium 116 BNP 47  CT abdomen pelvis showed anasarca enlarged prostate with glucose and ketones present On work-up of shortness of breath etc. he was found to have a left posterior shoulder abscess Orthopedics consulted performed a dedicated CT shoulder showing 6.2X 14 X8.3 abscess over left shoulder and I&D was performed Infectious disease consulted recommended Ancef    Hospital-Problem based course  Acute hypervolemic hyponatremia-secondary to anasarca liver disease versus heart failure--could be a mixed picture? -Initial sodium 116 -po Lasix 40 daily -sodium steadily improving with fluid restriction and Lasix and is now 132-stop checking labs today - US liver shows steatohepatitis, and Echo also shows grd 1 DD -start aldactone 50 daily today--teach strict I/o, salt less than 2 grm, fluid restrict to patient, family - Will need labs in about a week, if returns within 7 to 10 days to [place]--I will discuss this with sister prior to discharge  EF 65-70% % hyperdynamic circulation-likely secondary to hypertensive heart disease -resume enalapril at 2.5 --[risk hyperkalemia with Aldactone ] - Currently on Lasix 40 daily p.o.  Shoulder abscess - Status post I&D 7/19 growing gram-positive cocci- -Follow blood culture X2 from 7/19 [ngtd x 48 h] -wound cult few GRm + cocci only -Ancef-->keflex for total 10 days therapy -Patient sister who he lives with has been taught dressing changes by orthopedics on 7/21  DM TY 2 A1c 9 this admission - Patient ideally should be on insulin according  to guidelines -Implement metformin 500 bid, start amaryl 1 mg q am -Discharged on NPH insulin - Blood sugars ranging 120s to 170s - Greatly appreciate diabetic coordinator continued engagement and teaching regarding diabetes-thank you  Prostate enlargement -Continue Flomax 0.4 started this admission    DVT prophylaxis: Lovenox Code Status: Full Family Communication: Sister Disposition:  Status is: Inpatient  Remains inpatient appropriate because:Hemodynamically unstable and Unsafe d/c plan  Dispo: The patient is from: Home              Anticipated d/c is to: Home likely can discharge 7/23              Patient currently is not medically stable to d/c.   Difficult to place patient No    Consultants:  Orthopedics Infectious disease telephone consulted  Procedures: I&D and copious irrigation at bedside 7/19  Antimicrobials: Ancef 7/19   Subjective:  Awake alert seems well I reviewed the patient again later in the day diabetes coordinator during Sister seems comfortable with titration in the outpatient setting   Objective: Vitals:   11/14/20 2031 11/15/20 0502 11/15/20 0718 11/15/20 1128  BP: (!) 167/90 (!) 153/74 (!) 159/75 (!) 150/87  Pulse: 93 79 72 81  Resp: _0 Temp: 97.6 F (36.4 C) 97.6 F (36.4 C) 97.7 F (36.5 C) 97.8 F (36.6 C)  TempSrc: Oral Oral Oral Oral  SpO2: 97% 98% 94% 96%  Weight:      Height:        Intake/Output Summary (Last 24 hours) at 11/15/2020 1452 Last data filed at 11/15/2020 0700 Gross per 24 hour  Intake 1076.83 ml  Output 150 ml  Net 926.83 ml    Filed Weights   11/12/20 0711 11/13/20 0459 11/14/20 0506  Weight: 88.9 kg 76.1 kg 76.1 kg    Examination:  No distress, has a bruise to his nose neck soft supple mild JVD Chest relatively clear abdomen distended slight shifting dullness Trace lower extremity edema seems much more coherent   Data Reviewed: personally reviewed   CBC    Component Value Date/Time    WBC 5.2 11/14/2020 0104   RBC 3.85 (L) 11/14/2020 0104   HGB 11.8 (L) 11/14/2020 0104   HCT 34.7 (L) 11/14/2020 0104   PLT 295 11/14/2020 0104   MCV 90.1 11/14/2020 0104   MCH 30.6 11/14/2020 0104   MCHC 34.0 11/14/2020 0104   RDW 12.1 11/14/2020 0104   LYMPHSABS 1.3 11/14/2020 0104   MONOABS 0.5 11/14/2020 0104   EOSABS 0.1 11/14/2020 0104   BASOSABS 0.0 11/14/2020 0104   CMP Latest Ref Rng & Units 11/15/2020 11/14/2020 11/14/2020  Glucose 70 - 99 mg/dL 157(H) 234(H) 178(H)  BUN 8 - 23 mg/dL _0 Creatinine 0.61 - 1.24 mg/dL 0.67 0.73 0.62  Sodium 135 - 145 mmol/L 132(L) 128(L) 129(L)  Potassium 3.5 - 5.1 mmol/L 3.6 3.8 3.5  Chloride 98 - 111 mmol/L 95(L) 94(L) 91(L)  CO2 22 - 32 mmol/L _1 Calcium 8.9 - 10.3 mg/dL 8.4(L) 8.2(L) 8.2(L)  Total Protein 6.5 - 8.1 g/dL 5.9(L) - 5.9(L)  Total Bilirubin 0.3 - 1.2 mg/dL 0.4 - 0.5  Alkaline Phos 38 - 126 U/L 60 - 74  AST 15 - 41 U/L 31 - 23  ALT 0 - 44 U/L 23 - 25     Radiology Studies: US Abdomen Limited RUQ (LIVER/GB)  Result Date: 11/13/2020 CLINICAL DATA:  Evaluate for possible cirrhotic change EXAM: ULTRASOUND ABDOMEN LIMITED RIGHT UPPER QUADRANT COMPARISON:  CT from the previous day. FINDINGS: Gallbladder: Surgically removed Common bile duct: Diameter: 8.8 mm. Liver: Mild heterogeneity of the liver is noted without focal mass. Generalized increased echogenicity is noted. Portal vein is patent on color Doppler imaging with normal direction of blood flow towards the liver. Other: None. IMPRESSION: Echogenicity changes in the liver suggestive of fatty infiltration or early cirrhosis. No discrete mass is noted. Electronically Signed   By: Inez Catalina M.D.   On: 11/13/2020 15:40     Scheduled Meds:  cephALEXin  500 mg Oral Q8H   enoxaparin (LOVENOX) injection  40 mg Subcutaneous Q24H   feeding supplement (GLUCERNA SHAKE)  237 mL Oral TID BM   furosemide  40 mg Oral Daily   glimepiride  1 mg Oral Q breakfast   insulin  aspart  0-6 Units Subcutaneous TID WC   insulin aspart protamine- aspart  4 Units Subcutaneous BID WC   insulin starter kit- pen needles  1 kit Other Once   living well with diabetes book   Does not apply Once   living well with diabetes book- in spanish   Does not apply Once   metFORMIN  500 mg Oral BID WC   sodium chloride flush  3 mL Intravenous Q12H   spironolactone  50 mg Oral Daily   Continuous Infusions:     LOS: 3 days   Time spent: 45 minutes including care coordination  Nita Sells, MD [facility] To contact the attending provider between 7A-7P or the covering provider during after hours 7P-7A, please log into the web site [email or web address] and access using universal [place]  password for that web site. If you do not have the password, please call the hospital operator.  11/15/2020, 2:52 PM

## 2020-11-15 NOTE — Plan of Care (Signed)
Nutrition Education Note  RD consulted for nutrition education regarding new diagnosis of type 2 diabetes and uncontrolled hypertension.   Lab Results  Component Value Date   HGBA1C 9.1 (H) 11/12/2020   RD provided "Carbohydrate Counting for People with Diabetes" and "Low Sodium Nutrition Therapy" handouts from the [employer] in Spanish. Discussed by way of interpreter different food groups and their effects on blood sugar, emphasizing carbohydrate-containing foods. Provided list of carbohydrates and recommended serving sizes of common foods in Spanish.  Discussed importance of controlled and consistent carbohydrate/sodium intake throughout the day. Provided examples of ways to balance meals/snacks and encouraged intake of high-fiber, whole grain complex carbohydrates, as well as ways to lessen the amount of salt used in cooking and food preparation. Teach back method used.  Expect good compliance.  Body mass index is 26.28 kg/m. Pt meets criteria for Overweight based on current BMI.  Current diet order is [employer] Healthy/Carb Modified, patient is consuming approximately 70-100% of meals at this time. Labs and medications reviewed.   No further nutrition interventions warranted at this time. RD contact information provided. If additional nutrition issues arise, please re-consult RD.  Vertell Limber, RD, LDN (she/her/hers) Registered Dietitian I After-Hours/Weekend Pager # in [place]

## 2020-11-16 LAB — GLUCOSE, CAPILLARY
Glucose-Capillary: 170 mg/dL — ABNORMAL HIGH (ref 70–99)
Glucose-Capillary: 174 mg/dL — ABNORMAL HIGH (ref 70–99)

## 2020-11-16 NOTE — Discharge Summary (Addendum)
Physician Discharge Summary  Christian Green RXV:[id number] DOB: [birth year] DOA: 11/11/2020  PCP: Pcp, No  Admit date: 11/11/2020 Discharge date: 11/16/2020  Time spent: 47 minutes  Recommendations for Outpatient Follow-up:  Will need TOC outpatient follow-up set scheduled for 3 to 5 days to make sure labs are okay and sugars controlled-TOC to set this up prior to discharge Needs Chem-12, CBC in 1 week and titration of enalapril based on this may be addition of metoprolol when he gets back to [place] Needs wound check of shoulder to ensure healing well on follow-up and completion of antibiotics as per [facility] New meds this admission-Lasix, Aldactone, Keflex, Amaryl, metformin, insulin-meter provided by diabetes coordinator as well as supplies Extensive diabetic and diet education performed while hospitalized will need to follow-up in the outpatient setting  Discharge Diagnoses:  MAIN problem for hospitalization   Severe hyponatremia on admission Shoulder abscess New onset diabetes mellitus type 2 New onset hepatic steatosis  Please see below for itemized issues addressed in HOpsital- refer to other progress notes for clarity if needed  Discharge Condition: Improved  Diet recommendation: Heart healthy diabetic  Filed Weights   11/13/20 0459 11/14/20 0506 11/16/20 0500  Weight: 76.1 kg 76.1 kg 71.4 kg    History of present illness:  65 year old male visiting from [place] Known history of autism hypertension Developed 1 week bilateral lower extremity edema + less talkative more fatigue Also noticed high blood sugars in the 200s Came to the emergency room found to have a T-max 100.8, hypotensive 90 range initially white count 10 sodium 116 BNP 47 CT abdomen pelvis showed anasarca enlarged prostate with glucose and ketones present On work-up of shortness of breath etc. he was found to have a left posterior shoulder abscess Orthopedics consulted performed a dedicated CT shoulder showing 6.2X  14 X8.3 abscess over left shoulder and I&D was performed Infectious disease curb sided and recommended Ancef  Hospital Course:  Acute hypervolemic hyponatremia-secondary to anasarca liver disease versus heart failure--could be a mixed picture? 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 which he will continue on discharge   Shoulder abscess - Status post I&D 7/19 growing gram-positive cocci- -Follow blood culture X2 from 7/19 [ngtd x 48 h] -wound cult few GRm + cocci only -ID curb sided and recommended Ancef and patient was narrowed to Keflex and will continue wound dressings   DM TY 2 A1c 9 this admission - Patient ideally should be on insulin according to guidelines -Implement metformin 500 bid, start amaryl 1 mg q am -Discharged on NPH insulin and low-dose - Blood sugars ranging for the most part below 200 on discharge   Prostate enlargement -Continue Flomax 0.4 started this admission  Procedures: I&D of left shoulder Consultations: Orthopedics  Discharge Exam: Vitals:   11/16/20 0512 11/16/20 0757  BP: (!) 152/87 (!) 148/87  Pulse: 80 96  Resp: 17  16  Temp: 98.1 F (36.7 C) 97.6 F (36.4 C)  SpO2: 95% 96%    Subj on day of d/c   Awake coherent looks much better no distress family at bedside eating  drinking seems less swollen  General Exam on discharge  EOMI NCAT no focal deficit bruise to nose CTA B no added sound no rales no rhonchi Abdomen obese nontender no rebound no guarding slight tympany No lower extremity edema ROM intact neurologically intact moving all 4 limbs equally  Discharge Instructions    Allergies as of 11/16/2020   No Known Allergies      Medication List     TAKE these medications    cephALEXin 500 MG capsule Commonly known as: KEFLEX Take 1 capsule (500 mg total) by mouth every 8 (eight) hours.   enalapril 10 MG tablet Commonly known as: VASOTEC Take 1 tablet (10 mg total) by mouth 2 (two) times daily. What changed: medication strength   furosemide 40 MG tablet Commonly known as: LASIX Tome 1 tableta (40 mg en total) por va oral diariamente. (Take 1 tablet (40 mg total) by mouth daily.)   glimepiride 2 MG tablet Commonly known as: AMARYL Tome 0.5 tableta (1 mg en total) por va oral diariamente con el desayuno. (Take 0.5 tablets (1 mg total) by mouth daily with breakfast.)   HumuLIN N KwikPen 100 UNIT/ML Kiwkpen Generic drug: Insulin NPH (Human) (Isophane) Inject 8 Units into the skin daily before breakfast.   metFORMIN 500 MG tablet Commonly known as: GLUCOPHAGE Take 1 tablet (500 mg total) by mouth 2 (two) times daily with a meal.   Pentips 32G X 4 MM Misc Generic drug: Insulin Pen Needle Use as directed for 1 dose.   spironolactone 50 MG tablet Commonly known as: ALDACTONE Tome 1 tableta (50 mg en total) por va oral diariamente. (Take 1 tablet (50 mg total) by mouth daily.)       No Known Allergies    The results of significant diagnostics from this hospitalization (including imaging, microbiology, ancillary and laboratory) are listed below for reference.    Significant Diagnostic Studies: DG Chest 2 View  Result Date: 11/11/2020 CLINICAL DATA:  Shortness of breath EXAM: CHEST - 2 VIEW COMPARISON:  None. FINDINGS:  Elevation of left diaphragm. Air distended bowel in the left upper quadrant. No consolidation, pleural effusion or pneumothorax. Normal cardiac size. IMPRESSION: Elevation of left diaphragm. Air distended bowel in the left upper quadrant which may be evaluated with dedicated abdominal radiograph Electronically Signed   By: Jasmine Pang M.D.   On: 11/11/2020 20:14   CT Head Wo Contrast  Result Date: 11/12/2020 CLINICAL DATA:  Hyponatremia EXAM: CT HEAD WITHOUT CONTRAST TECHNIQUE: Contiguous axial images were obtained from the base of the skull through the vertex without intravenous contrast. COMPARISON:  None. FINDINGS: Brain: No evidence of acute infarction, hemorrhage, hydrocephalus, extra-axial collection or mass lesion/mass effect. Mild atrophic changes and chronic white matter ischemic changes are noted. Vascular: No hyperdense vessel or unexpected calcification. Skull: Normal. Negative for fracture or focal lesion. Sinuses/Orbits: No acute finding. Other: None. IMPRESSION: Chronic atrophic and ischemic changes without acute abnormality. Electronically Signed   By: Alcide Clever M.D.   On: 11/12/2020 08:27   CT Angio Chest PE W and/or Wo Contrast  Result Date: 11/12/2020 CLINICAL DATA:  Fever and diarrhea EXAM: CT ANGIOGRAPHY CHEST CT ABDOMEN AND PELVIS WITH CONTRAST TECHNIQUE: Multidetector CT imaging of the chest was performed using the standard protocol during bolus administration of  intravenous contrast. Multiplanar CT image reconstructions and MIPs were obtained to evaluate the vascular anatomy. Multidetector CT imaging of the abdomen and pelvis was performed using the standard protocol during bolus administration of intravenous contrast. CONTRAST:  OMNIPAQUE IOHEXOL 350 MG/ML SOLN COMPARISON:  None. FINDINGS: CTA CHEST FINDINGS Cardiovascular: Normal heart size. No pericardial effusion. No acute aortic finding. No pulmonary artery filling defect Mediastinum/Nodes: Negative for mediastinal  adenopathy or mass. Left axillary adenopathy associated with the following musculoskeletal findings. Lungs/Pleura: Bands of opacity in the bilateral lungs consistent with atelectasis. There is no edema, consolidation, effusion, or pneumothorax. Musculoskeletal: Large partially covered subcutaneous gas and fluid collection posterior to the left shoulder, insinuating along the musculature. Dimensions are at least 9 x 5 cm. No evidence of underlying bony erosion. Generalized spondylosis with scoliosis. Review of the MIP images confirms the above findings. Case discussed with Dr. Eudelia Bunch. [place] and autistic with limited history. CT ABDOMEN and PELVIS FINDINGS Hepatobiliary: No focal liver abnormality.Cholecystectomy. Pancreas: Unremarkable. Spleen: Unremarkable. Adrenals/Urinary Tract: Negative adrenals. No hydronephrosis or stone. Moderately distended bladder. Stomach/Bowel: No obstruction. Multiple segments of gas distended colon but no generalized obstructive pattern. Vascular/Lymphatic: No acute vascular abnormality. No mass or adenopathy. Reproductive:Enlarged prostate with central gland projecting into the bladder base. Other: No ascites or pneumoperitoneum. Musculoskeletal: Spondylosis.  No acute or focal finding. Review of the MIP images confirms the above findings. IMPRESSION: Chest CTA: 1. Large, partially covered gas and fluid collection about the left shoulder, presumably abscess. 2. Negative for pulmonary embolism. Abdominal CT: 1. No acute finding. 2. Mild anasarca. 3. Enlarged prostate with moderately distended bladder. Electronically Signed   By: Marnee Spring M.D.   On: 11/12/2020 05:21   CT ABDOMEN PELVIS W CONTRAST  Result Date: 11/12/2020 CLINICAL DATA:  Fever and diarrhea EXAM: CT ANGIOGRAPHY CHEST CT ABDOMEN AND PELVIS WITH CONTRAST TECHNIQUE: Multidetector CT imaging of the chest was performed using the standard protocol during bolus administration of  intravenous contrast. Multiplanar CT image reconstructions and MIPs were obtained to evaluate the vascular anatomy. Multidetector CT imaging of the abdomen and pelvis was performed using the standard protocol during bolus administration of intravenous contrast. CONTRAST:  OMNIPAQUE IOHEXOL 350 MG/ML SOLN COMPARISON:  None. FINDINGS: CTA CHEST FINDINGS Cardiovascular: Normal heart size. No pericardial effusion. No acute aortic finding. No pulmonary artery filling defect Mediastinum/Nodes: Negative for mediastinal adenopathy or mass. Left axillary adenopathy associated with the following musculoskeletal findings. Lungs/Pleura: Bands of opacity in the bilateral lungs consistent with atelectasis. There is no edema, consolidation, effusion, or pneumothorax. Musculoskeletal: Large partially covered subcutaneous gas and fluid collection posterior to the left shoulder, insinuating along the musculature. Dimensions are at least 9 x 5 cm. No evidence of underlying bony erosion. Generalized spondylosis with scoliosis. Review of the MIP images confirms the above findings. Case discussed with Dr. Eudelia Bunch. [place] and autistic with limited history. CT ABDOMEN and PELVIS FINDINGS Hepatobiliary: No focal liver abnormality.Cholecystectomy. Pancreas: Unremarkable. Spleen: Unremarkable. Adrenals/Urinary Tract: Negative adrenals. No hydronephrosis or stone. Moderately distended bladder. Stomach/Bowel: No obstruction. Multiple segments of gas distended colon but no generalized obstructive pattern. Vascular/Lymphatic: No acute vascular abnormality. No mass or adenopathy. Reproductive:Enlarged prostate with central gland projecting into the bladder base. Other: No ascites or pneumoperitoneum. Musculoskeletal: Spondylosis.  No acute or focal finding. Review of the MIP images confirms the above findings. IMPRESSION: Chest CTA: 1. Large, partially covered gas and fluid collection about the left  shoulder,  presumably abscess. 2. Negative for pulmonary embolism. Abdominal CT: 1. No acute finding. 2. Mild anasarca. 3. Enlarged prostate with moderately distended bladder. Electronically Signed   By: Marnee SpringJonathon  Watts M.D.   On: 11/12/2020 05:21   CT Shoulder Left Wo Contrast  Result Date: 11/12/2020 CLINICAL DATA:  Shoulder abscess. EXAM: CT OF THE UPPER LEFT EXTREMITY WITHOUT CONTRAST TECHNIQUE: Multidetector CT imaging of the left shoulder was performed according to the standard protocol. COMPARISON:  None. FINDINGS: Bones/Joint/Cartilage No bony destruction or periosteal reaction. No fracture or dislocation. Mild degenerative changes of the acromioclavicular and glenohumeral joints. No joint effusion. Ligaments Ligaments are suboptimally evaluated by CT. Muscles and Tendons Grossly intact.  No muscle atrophy. Soft tissue Superficial large 6.2 x 14.3 x 8.3 cm gas and fluid collection overlying the posterior shoulder. Reactive left axillary lymph nodes. No soft tissue mass. Mild linear scarring/atelectasis in the left lung. Calcified granuloma in the lingula. IMPRESSION: 1. Superficial 6.2 x 14.3 x 8.3 cm abscess overlying the posterior shoulder. 2. No acute osseous abnormality. Electronically Signed   By: Obie DredgeWilliam T Derry M.D.   On: 11/12/2020 08:13   DG Knee Complete 4 Views Left  Result Date: 11/12/2020 CLINICAL DATA:  Swelling.  History of fall. EXAM: LEFT KNEE - COMPLETE 4+ VIEW COMPARISON:  No prior. FINDINGS: Degenerative changes left knee, most prominent about the medial compartment. No evidence of acute fracture or dislocation. No effusion noted. IMPRESSION: Degenerative changes left knee.  No acute bony or joint abnormality. Electronically Signed   By: Maisie Fushomas  Register   On: 11/12/2020 06:40   DG Knee Complete 4 Views Right  Result Date: 11/12/2020 CLINICAL DATA:  Swelling.  Prior history of fall. EXAM: RIGHT KNEE - COMPLETE 4+ VIEW COMPARISON:  No recent prior. FINDINGS: Degenerative change noted  about the right knee. Degenerative changes most prominent about the medial compartment. No evidence of acute fracture or dislocation. No prominent effusion noted. Mild peripheral vascular calcification cannot be excluded. IMPRESSION: Degenerative changes right knee. No evidence of acute fracture or dislocation. Electronically Signed   By: Maisie Fushomas  Register   On: 11/12/2020 06:39   ECHOCARDIOGRAM LIMITED  Result Date: 11/13/2020    ECHOCARDIOGRAM LIMITED REPORT   Patient Name:   Christian LanceJORGE Leano Date of Exam: 11/13/2020 Medical Rec #:  [id number]   Height:       67.0 in Accession #:    [id number]  Weight:       167.8 lb Date of Birth:  07/20/1955    BSA:          1.877 m Patient Age:    65 years    BP:           145/85 mmHg Patient Gender: M           HR:           93 bpm. Exam Location:  Inpatient Procedure: Limited Echo, Cardiac Doppler, Color Doppler and Intracardiac            Opacification Agent Indications:    Abnormal EKG  History:        Patient has no prior history of Echocardiogram examinations.                 Arrythmias:RBBB, Signs/Symptoms:LE edema; Risk                 Factors:Hypertension and Diabetes. Autism-non verbal.  Sonographer:    Lavenia AtlasBrooke Strickland Referring Phys: [id number] RONDELL A SMITH  Sonographer Comments: Technically difficult study  due to poor echo windows, suboptimal parasternal window and suboptimal apical window. Non verbal patient. IMPRESSIONS  1. LV outflow gradient, peak 26 mmHg. Left ventricular ejection fraction, by estimation, is 65 to 70%. The left ventricle has hyperdynamic function. The left ventricle has no regional wall motion abnormalities. Left ventricular diastolic parameters are consistent with Grade I diastolic dysfunction (impaired relaxation).  2. Right ventricular systolic function was not well visualized. The right ventricular size is not well visualized. Tricuspid regurgitation signal is inadequate for assessing PA pressure.  3. The mitral valve was not well visualized.   4. The aortic valve was not well visualized.  5. Very limited images technically. The right heart was not visualized. FINDINGS  Left Ventricle: LV outflow gradient, peak 26 mmHg. Left ventricular ejection fraction, by estimation, is 65 to 70%. The left ventricle has hyperdynamic function. The left ventricle has no regional wall motion abnormalities. Definity contrast agent was given IV to delineate the left ventricular endocardial borders. Left ventricular diastolic parameters are consistent with Grade I diastolic dysfunction (impaired relaxation). Right Ventricle: The right ventricular size is not well visualized. Right ventricular systolic function was not well visualized. Tricuspid regurgitation signal is inadequate for assessing PA pressure. Left Atrium: Left atrial size was normal in size. Right Atrium: Right atrial size was not well visualized. Mitral Valve: The mitral valve was not well visualized. Aortic Valve: The aortic valve was not well visualized. Pulmonic Valve: The pulmonic valve was not well visualized. Aorta: The aortic root was not well visualized. Venous: The inferior vena cava was not well visualized.  Diastology LV e' medial:    6.53 cm/s LV E/e' medial:  9.1 LV e' lateral:   7.77 cm/s LV E/e' lateral: 7.7  LEFT ATRIUM           Index LA Vol (A4C): 26.5 ml 14.12 ml/m  AORTIC VALVE LVOT Vmax:   118.00 cm/s LVOT Vmean:  86.200 cm/s LVOT VTI:    0.196 m MITRAL VALVE MV Area (PHT): 8.25 cm    SHUNTS MV Decel Time: 92 msec     Systemic VTI: 0.20 m MV E velocity: 59.60 cm/s MV A velocity: 99.50 cm/s MV E/A ratio:  0.60 Marca Ancona MD Electronically signed by Marca Ancona MD Signature Date/Time: 11/13/2020/5:26:30 PM    Final    US Abdomen Limited RUQ (LIVER/GB)  Result Date: 11/13/2020 CLINICAL DATA:  Evaluate for possible cirrhotic change EXAM: ULTRASOUND ABDOMEN LIMITED RIGHT UPPER QUADRANT COMPARISON:  CT from the previous day. FINDINGS: Gallbladder: Surgically removed Common bile duct:  Diameter: 8.8 mm. Liver: Mild heterogeneity of the liver is noted without focal mass. Generalized increased echogenicity is noted. Portal vein is patent on color Doppler imaging with normal direction of blood flow towards the liver. Other: None. IMPRESSION: Echogenicity changes in the liver suggestive of fatty infiltration or early cirrhosis. No discrete mass is noted. Electronically Signed   By: Alcide Clever M.D.   On: 11/13/2020 15:40    Microbiology: Recent Results (from the past 240 hour(s))  Resp Panel by RT-PCR (Flu A&B, Covid) Nasopharyngeal Swab     Status: None   Collection Time: 11/11/20  7:45 PM   Specimen: Nasopharyngeal Swab; Nasopharyngeal(NP) swabs in vial transport medium  Result Value Ref Range Status   SARS Coronavirus 2 by RT PCR NEGATIVE NEGATIVE Final    Comment: (NOTE) SARS-CoV-2 target nucleic acids are NOT DETECTED.  The SARS-CoV-2 RNA is generally detectable in upper respiratory specimens during the acute phase of infection. The lowest  concentration of SARS-CoV-2 viral copies this assay can detect is 138 copies/mL. A negative result does not preclude SARS-Cov-2 infection and should not be used as the sole basis for treatment or other patient management decisions. A negative result may occur with  improper specimen collection/handling, submission of specimen other than nasopharyngeal swab, presence of viral mutation(s) within the areas targeted by this assay, and inadequate number of viral copies(<138 copies/mL). A negative result must be combined with clinical observations, patient history, and epidemiological information. The expected result is Negative.  Fact Sheet for Patients:  BloggerCourse.com  Fact Sheet for Healthcare Providers:  SeriousBroker.it  This test is no t yet approved or cleared by the [place] FDA and  has been authorized for detection and/or diagnosis of SARS-CoV-2 by FDA under an  Emergency Use Authorization (EUA). This EUA will remain  in effect (meaning this test can be used) for the duration of the COVID-19 declaration under Section 564(b)(1) of the Act, 21 U.S.C.section 360bbb-3(b)(1), unless the authorization is terminated  or revoked sooner.       Influenza A by PCR NEGATIVE NEGATIVE Final   Influenza B by PCR NEGATIVE NEGATIVE Final    Comment: (NOTE) The Xpert Xpress SARS-CoV-2/FLU/RSV plus assay is intended as an aid in the diagnosis of influenza from Nasopharyngeal swab specimens and should not be used as a sole basis for treatment. Nasal washings and aspirates are unacceptable for Xpert Xpress SARS-CoV-2/FLU/RSV testing.  Fact Sheet for Patients: BloggerCourse.com  Fact Sheet for Healthcare Providers: SeriousBroker.it  This test is not yet approved or cleared by the [place] FDA and has been authorized for detection and/or diagnosis of SARS-CoV-2 by FDA under an Emergency Use Authorization (EUA). This EUA will remain in effect (meaning this test can be used) for the duration of the COVID-19 declaration under Section 564(b)(1) of the Act, 21 U.S.C. section 360bbb-3(b)(1), unless the authorization is terminated or revoked.  Performed at [facility], [address] [place], [place] [address]   Culture, blood (routine x 2)     Status: None (Preliminary result)   Collection Time: 11/12/20 11:34 AM   Specimen: BLOOD  Result Value Ref Range Status   Specimen Description BLOOD SITE NOT SPECIFIED  Final   Special Requests   Final    BOTTLES DRAWN AEROBIC AND ANAEROBIC Blood Culture results may not be optimal due to an excessive volume of blood received in culture bottles   Culture   Final    NO GROWTH 4 DAYS Performed at [facility], [address], [place], [place] [address]    Report Status PENDING  Incomplete  Culture, blood (routine x 2)     Status: None (Preliminary  result)   Collection Time: 11/12/20 11:37 AM   Specimen: BLOOD  Result Value Ref Range Status   Specimen Description BLOOD SITE NOT SPECIFIED  Final   Special Requests   Final    BOTTLES DRAWN AEROBIC AND ANAEROBIC Blood Culture results may not be optimal due to an excessive volume of blood received in culture bottles   Culture   Final    NO GROWTH 4 DAYS Performed at [facility], [address], [place], [place] [address]    Report Status PENDING  Incomplete  Aerobic Culture w Gram Stain (superficial specimen)     Status: None   Collection Time: 11/12/20 12:30 PM   Specimen: Back; Wound  Result Value Ref Range Status   Specimen Description BACK  Final  Special Requests NONE  Final   Gram Stain   Final    RARE WBC PRESENT,BOTH PMN AND MONONUCLEAR FEW GRAM POSITIVE COCCI IN PAIRS IN CLUSTERS    Culture   Final    NO GROWTH 2 DAYS Performed at [facility], [address], [place], [place] [address]    Report Status 11/15/2020 FINAL  Final     Labs: Basic Metabolic Panel: Recent Labs  Lab 11/12/20 1129 11/13/20 0539 11/14/20 0104 11/14/20 1816 11/15/20 0833  NA 125* 128* 129* 128* 132*  K 3.6 3.4* 3.5 3.8 3.6  CL 90* 92* 91* 94* 95*  CO2 26 27 30 25 31   GLUCOSE 146* 153* 178* 234* 157*  BUN 8 9 10 10 10   CREATININE 0.69 0.56* 0.62 0.73 0.67  CALCIUM 7.9* 7.9* 8.2* 8.2* 8.4*  PHOS  --   --   --  3.5  --    Liver Function Tests: Recent Labs  Lab 11/11/20 1943 11/14/20 0104 11/14/20 1816 11/15/20 0833  AST 41 23  --  31  ALT 32 25  --  23  ALKPHOS 74 74  --  60  BILITOT 0.7 0.5  --  0.4  PROT 6.1* 5.9*  --  5.9*  ALBUMIN 2.1* 2.0* 2.1* 2.1*   No results for input(s): LIPASE, AMYLASE in the last 168 hours. No results for input(s): AMMONIA in the last 168 hours. CBC: Recent Labs  Lab 11/11/20 1943 11/13/20 0539 11/14/20 0104  WBC 10.3 5.8 5.2  NEUTROABS 8.1*  --  3.3  HGB 11.8* 11.3* 11.8*  HCT 34.2* 33.1* 34.7*  MCV 90.5 89.7 90.1   PLT 252 234 295   Cardiac Enzymes: No results for input(s): CKTOTAL, CKMB, CKMBINDEX, TROPONINI in the last 168 hours. BNP: BNP (last 3 results) Recent Labs    11/11/20 1943  BNP 47.4    ProBNP (last 3 results) No results for input(s): PROBNP in the last 8760 hours.  CBG: Recent Labs  Lab 11/15/20 0716 11/15/20 1125 11/15/20 1610 11/15/20 2113 11/16/20 0801  GLUCAP 171* 126* 166* 138* 170*       Signed:  2114 MD   [facility] 11/16/2020, 10:26 AM

## 2020-11-16 NOTE — Progress Notes (Signed)
Discharge instruction given to patient and family at bedside. Medications reviewed and all questions were answered. Insulin pen education reviewed as well with its administration. Per TOC, Patient to follow up with his PCP when  he travels back to [place].

## 2020-11-16 NOTE — TOC Transition Note (Addendum)
Transition of Care [facility]) - CM/SW Discharge Note   Patient Details  Name: Christian Green MRN: [id number] Date of Birth: 1955/06/10  [facility] CM/SW Contact:  Bess Kinds, RN Phone Number: [phone] 11/16/2020, 11:00 AM   Clinical Narrative:     Notified of patient's readiness to discharge today. Noted in previous CM's note that medications had been arranged through [facility] pharmacy. Nursing to pick up discharge medications from main pharmacy. Clinic appointments are at least one month out. Noted that patient and sister are leaving the country next week, and sister to follow up with PCP back home.   Final next level of care: Home/Self Care Barriers to Discharge: No Barriers Identified   Patient Goals and CMS Choice        Discharge Placement                       Discharge Plan and Services   Discharge Planning Services: CM Consult                                 Social Determinants of Health (SDOH) Interventions     Readmission Risk Interventions No flowsheet data found.

## 2020-11-16 NOTE — Plan of Care (Signed)

## 2020-11-16 NOTE — Progress Notes (Signed)
Patient's sister was thought insulin administration and she demonstrated positive feedback by administering insulin via the right site.

## 2020-11-17 LAB — CULTURE, BLOOD (ROUTINE X 2)
Culture: NO GROWTH
Culture: NO GROWTH

## 2020-12-12 ENCOUNTER — Other Ambulatory Visit (HOSPITAL_COMMUNITY): Payer: Self-pay

## 2020-12-12 ENCOUNTER — Telehealth (HOSPITAL_COMMUNITY): Payer: Self-pay

## 2020-12-12 MED ORDER — METFORMIN HCL 500 MG PO TABS
500.0000 mg | ORAL_TABLET | Freq: Two times a day (BID) | ORAL | 1 refills | Status: AC
Start: 2020-12-12 — End: ?
  Filled 2020-12-12: qty 60, 30d supply, fill #0

## 2020-12-12 MED ORDER — ENALAPRIL MALEATE 10 MG PO TABS
10.0000 mg | ORAL_TABLET | Freq: Two times a day (BID) | ORAL | 1 refills | Status: AC
  Filled 2020-12-12: qty 60, 30d supply, fill #0

## 2020-12-12 NOTE — Telephone Encounter (Signed)
Pt called [name] regarding enalapril and metformin refills. Pt was expecting to have flown out of the country by now, but has been delayed. [employer] is trying to get him a flight back to his country.  I spoke with Dr Mahala Menghini, who verbally authorized 2 months of each enalapril and metformin with the stipulation that the patient get lab work done if he is still in the country in 2 weeks.  [facility] pharmacy will fill 1 month with 1 refill.

## 2022-09-21 IMAGING — CT CT ABD-PELV W/ CM
4 of 12 series · 14 of 46 positions shown, 18 images · IV contrast (APPLIED)
Comparison: None.

CLINICAL DATA: Fever and diarrhea

EXAM:
CT ANGIOGRAPHY CHEST
CT ABDOMEN AND PELVIS WITH CONTRAST
TECHNIQUE: Multidetector CT imaging of the chest was performed using the
standard protocol during bolus administration of intravenous
contrast. Multiplanar CT image reconstructions and MIPs were
obtained to evaluate the vascular anatomy. Multidetector CT imaging
of the abdomen and pelvis was performed using the standard protocol
during bolus administration of intravenous contrast.
CONTRAST:  100mL OMNIPAQUE IOHEXOL 350 MG/ML SOLN

[Series 6: arterial · axial · arterial · 0.74mm/px · z∈[-182,-60]mm · 3 of 123 slices shown]
[im 31/123  soft-tissue]
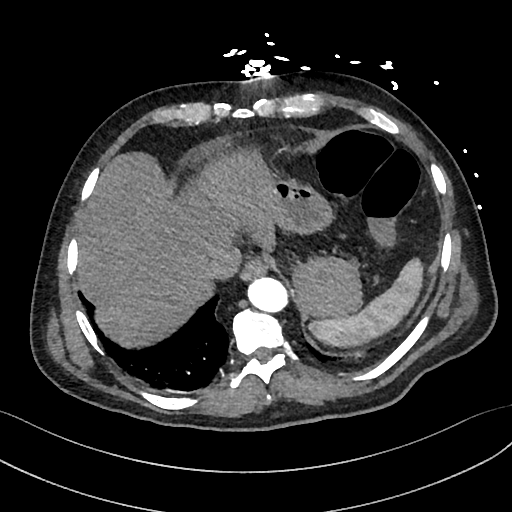
[im 62/123  soft-tissue]
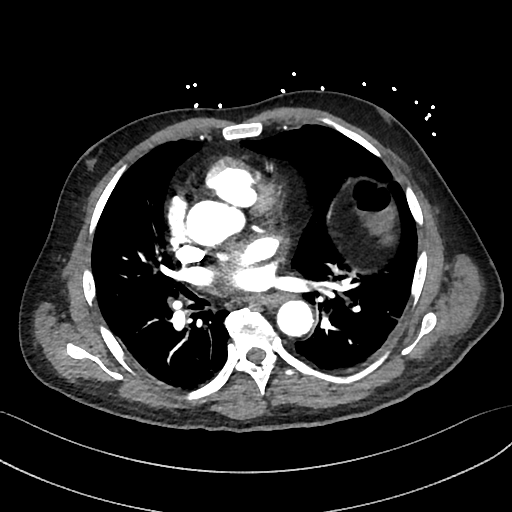
[im 92/123  soft-tissue]
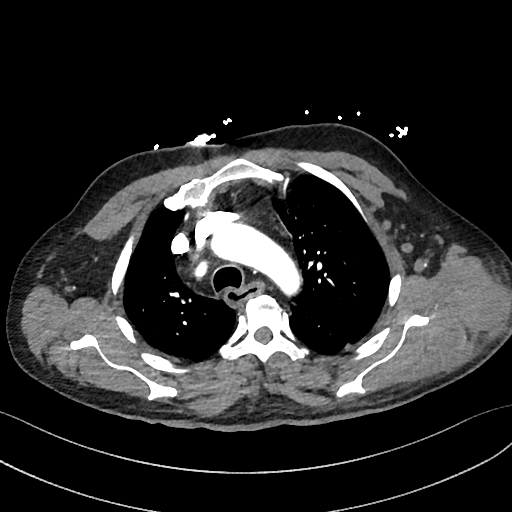

[Series 8: thins · axial · 0.74mm/px · z∈[-224,-17]mm · 8 of 351 slices shown]
[im 27/351  soft-tissue]
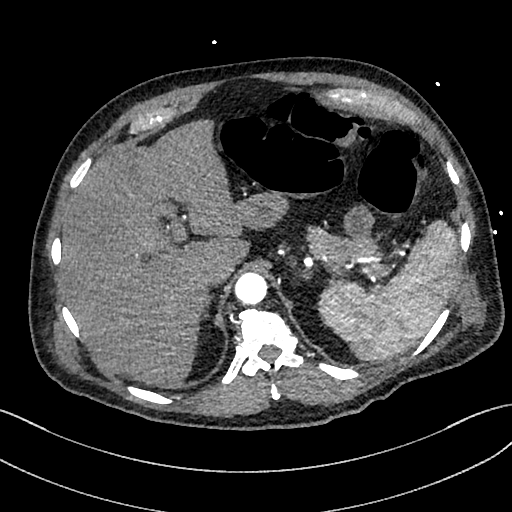
[im 81/351  soft-tissue]
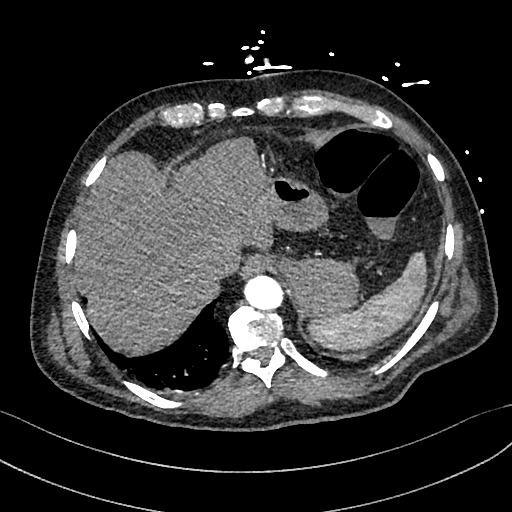
[im 108/351  soft-tissue]
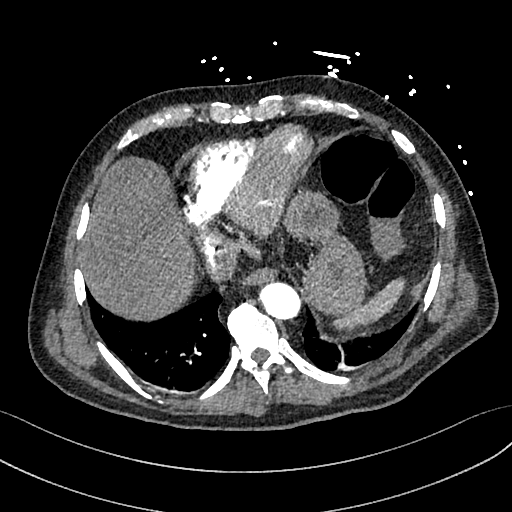
[im 162/351  soft-tissue]
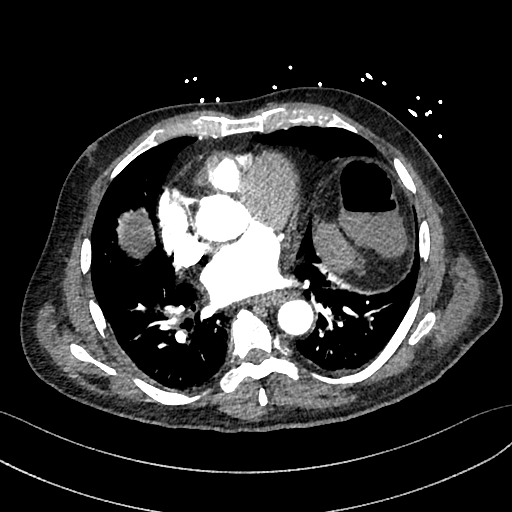
[im 189/351  soft-tissue]
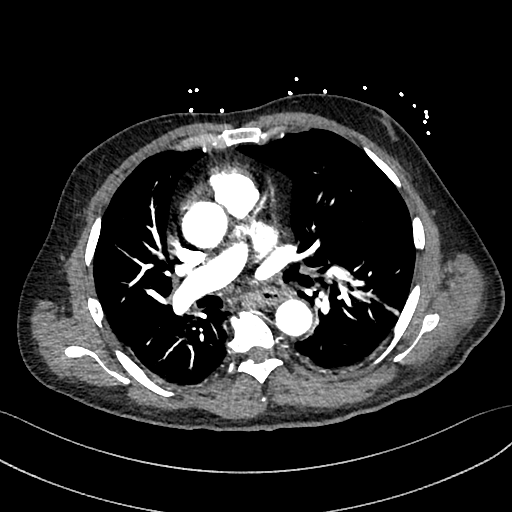
[im 243/351  soft-tissue]
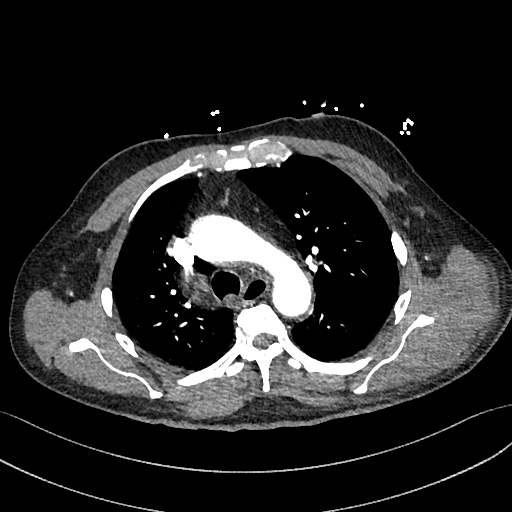
[im 270/351  soft-tissue]
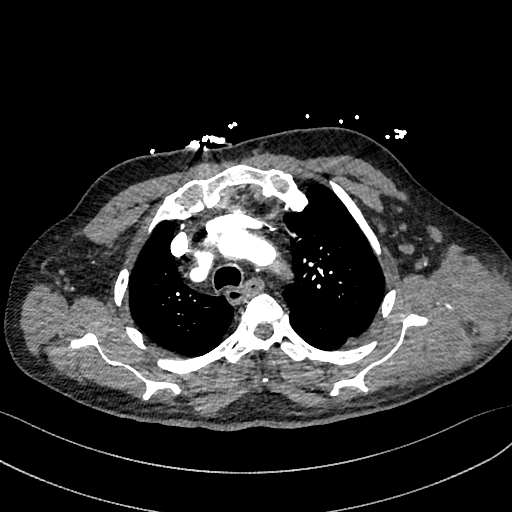
[im 324/351  soft-tissue]
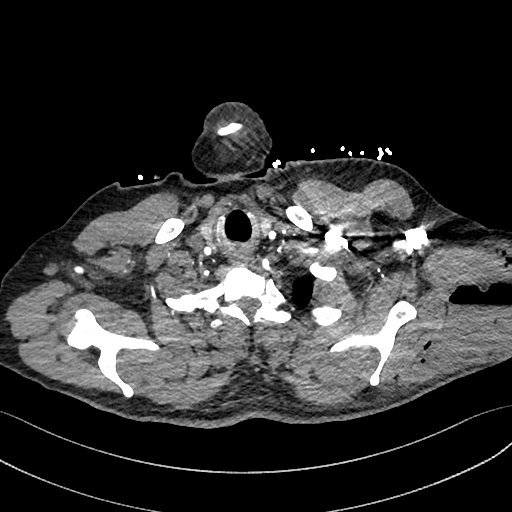

[Series 9: cor · coronal · 0.51mm/px · 1 of 139 slices shown, 2 images]
[im 70/139  soft-tissue]
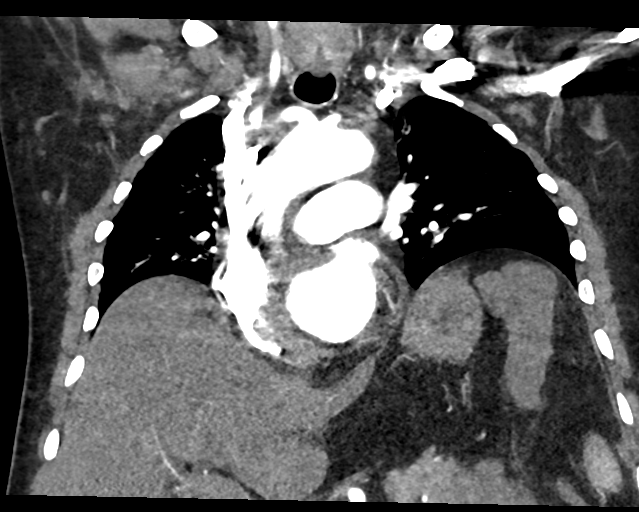
[im 70/139  bone]
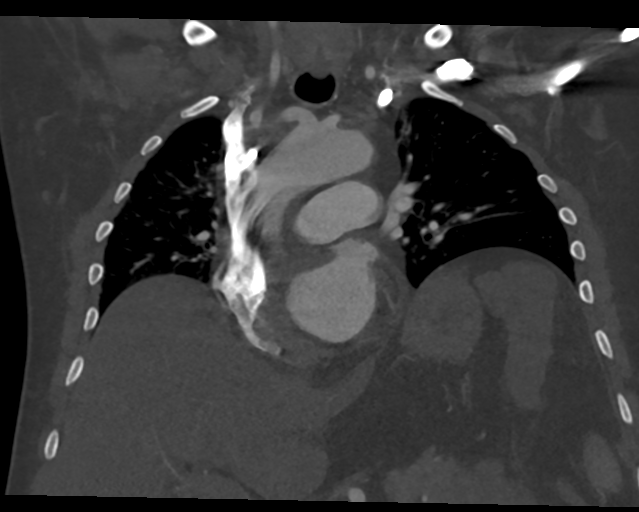

[Series 13: abdomen 5.0 · axial · 0.81mm/px · z∈[-437,-267]mm · 2 of 104 slices shown, 5 images]
[im 35/104  soft-tissue]
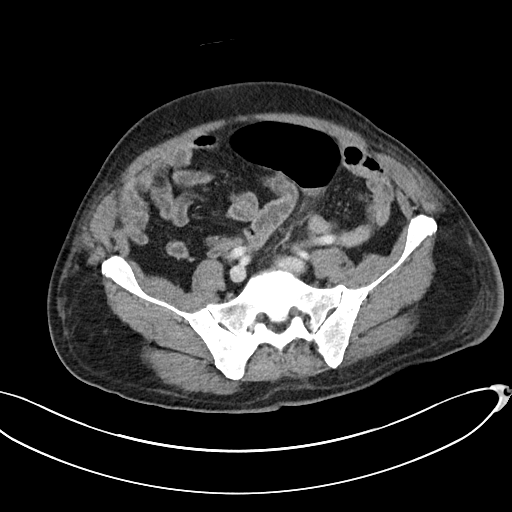
[im 35/104  lung]
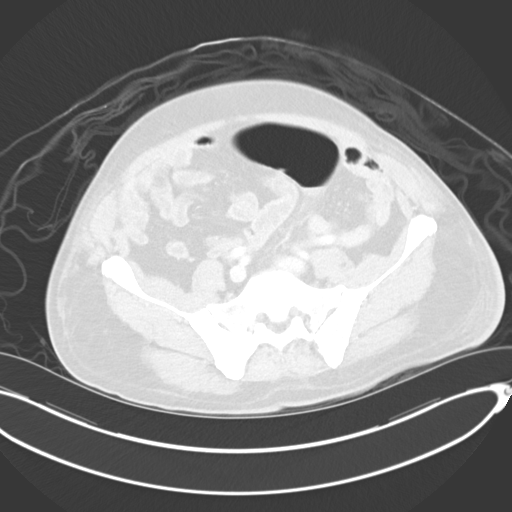
[im 35/104  bone]
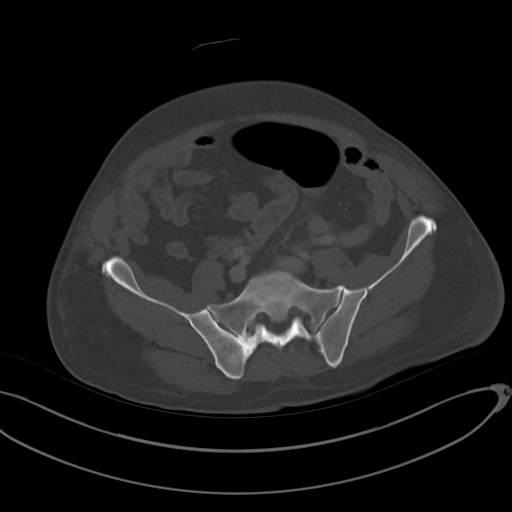
[im 69/104  soft-tissue]
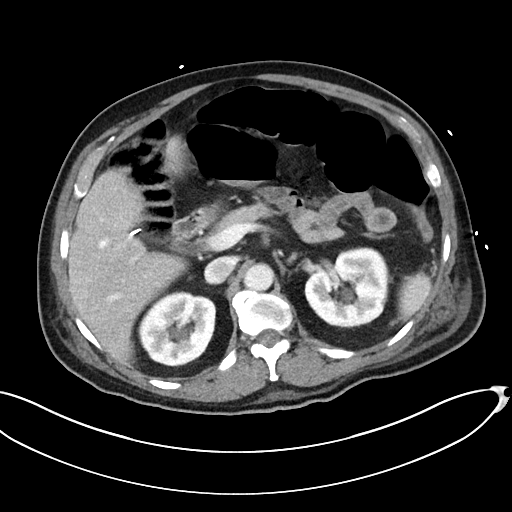
[im 69/104  lung]
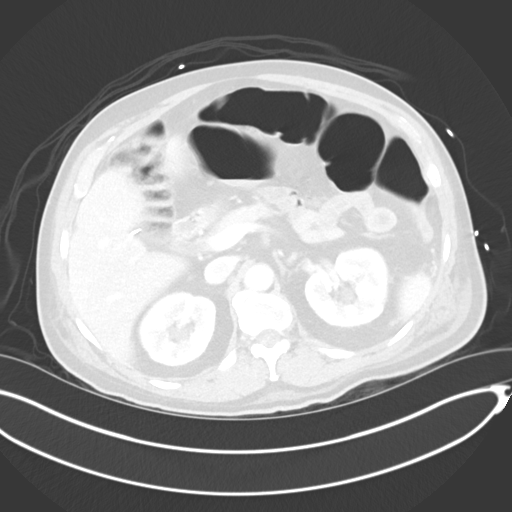

[14 of 46 positions shown; findings below may reference images not displayed]

FINDINGS: CTA CHEST FINDINGS

Cardiovascular: Normal heart size. No pericardial effusion. No acute
aortic finding. No pulmonary artery filling defect

Mediastinum/Nodes: Negative for mediastinal adenopathy or mass. Left
axillary adenopathy associated with the following musculoskeletal
findings.

Lungs/Pleura: Bands of opacity in the bilateral lungs consistent
with atelectasis. There is no edema, consolidation, effusion, or
pneumothorax.

Musculoskeletal: Large partially covered subcutaneous gas and fluid
collection posterior to the left shoulder, insinuating along the
musculature. Dimensions are at least 9 x 5 cm. No evidence of
underlying bony erosion.

Generalized spondylosis with scoliosis.

Review of the MIP images confirms the above findings.

Case discussed with Dr. Danii. [place] and autistic with limited history.

CT ABDOMEN and PELVIS FINDINGS

Hepatobiliary: No focal liver abnormality.Cholecystectomy.

Pancreas: Unremarkable.

Spleen: Unremarkable.

Adrenals/Urinary Tract: Negative adrenals. No hydronephrosis or
stone. Moderately distended bladder.

Stomach/Bowel: No obstruction. Multiple segments of gas distended
colon but no generalized obstructive pattern.

Vascular/Lymphatic: No acute vascular abnormality. No mass or
adenopathy.

Reproductive:Enlarged prostate with central gland projecting into
the bladder base.

Other: No ascites or pneumoperitoneum.

Musculoskeletal: Spondylosis.  No acute or focal finding.

Review of the MIP images confirms the above findings.
IMPRESSION: Chest CTA:

1. Large, partially covered gas and fluid collection about the left
shoulder, presumably abscess.
2. Negative for pulmonary embolism.

Abdominal CT:

1. No acute finding.
2. Mild anasarca.
3. Enlarged prostate with moderately distended bladder.

## 2022-09-22 IMAGING — US US ABDOMEN LIMITED
1 series · 14 of 25 positions shown · non-contrast
Comparison: CT from the previous day.

CLINICAL DATA: Evaluate for possible cirrhotic change

EXAM:
ULTRASOUND ABDOMEN LIMITED RIGHT UPPER QUADRANT

[Series 1: us abdomen limited ruq (liver/gb) · 14 of 29 slices shown]
[im 1/29]
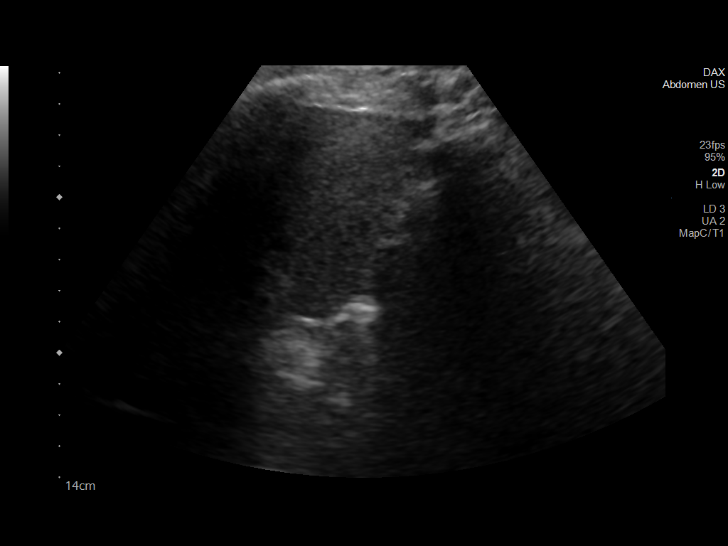
[im 3/29]
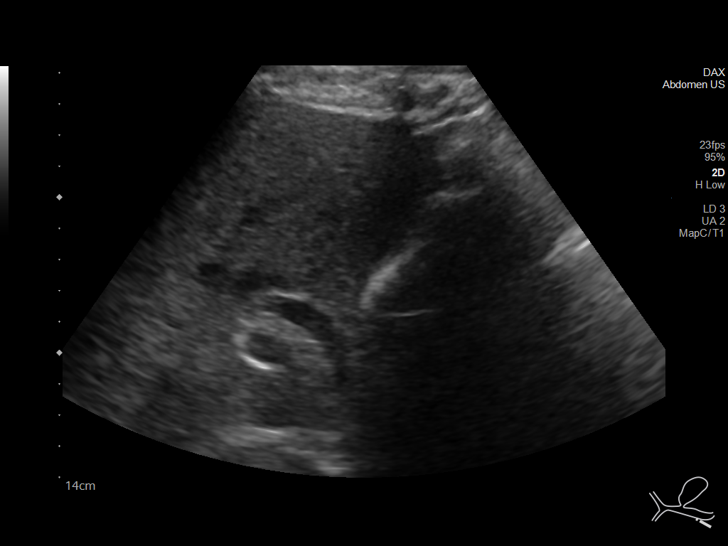
[im 5/29]
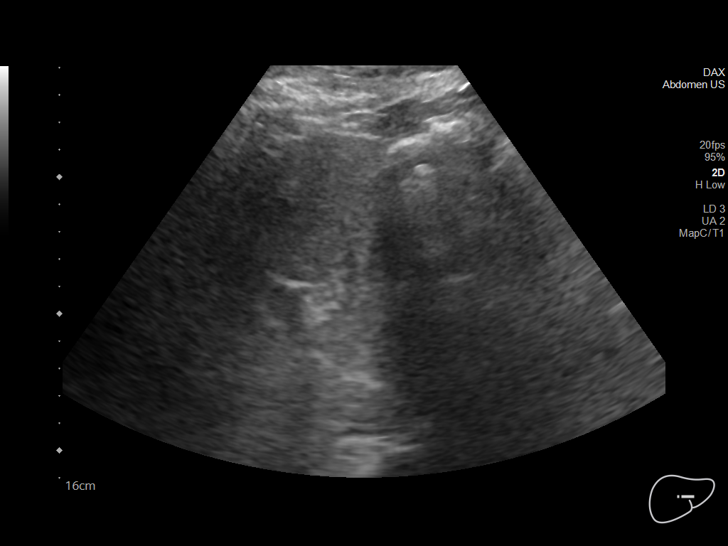
[im 8/29]
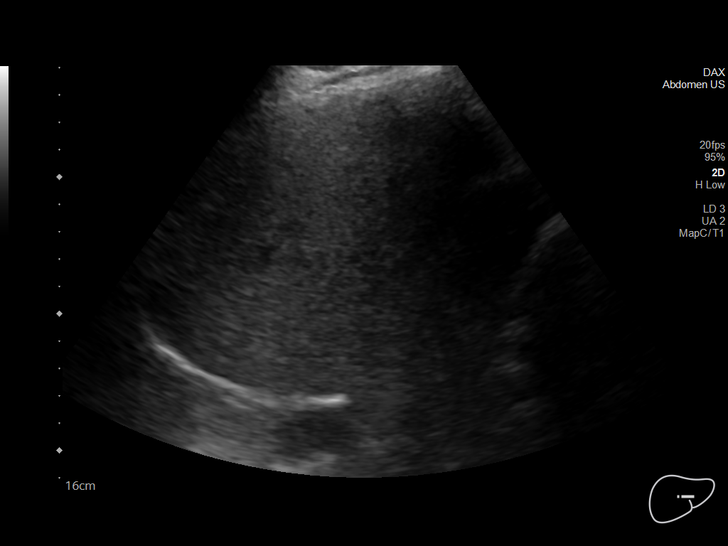
[im 10/29]
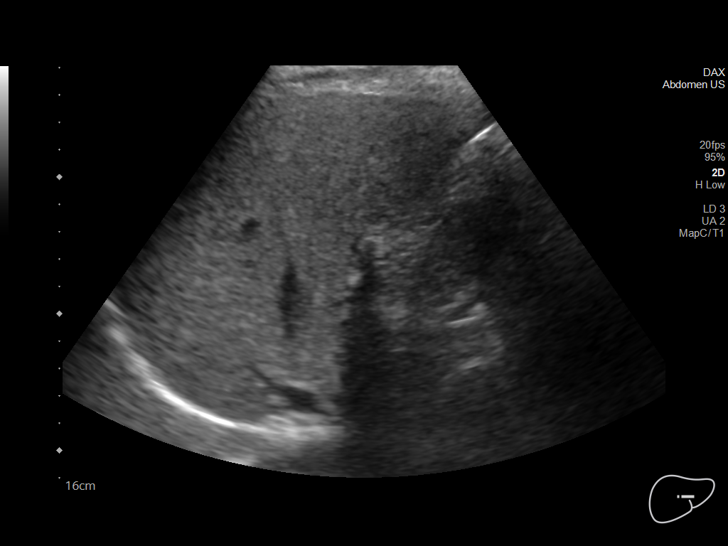
[im 11/29]
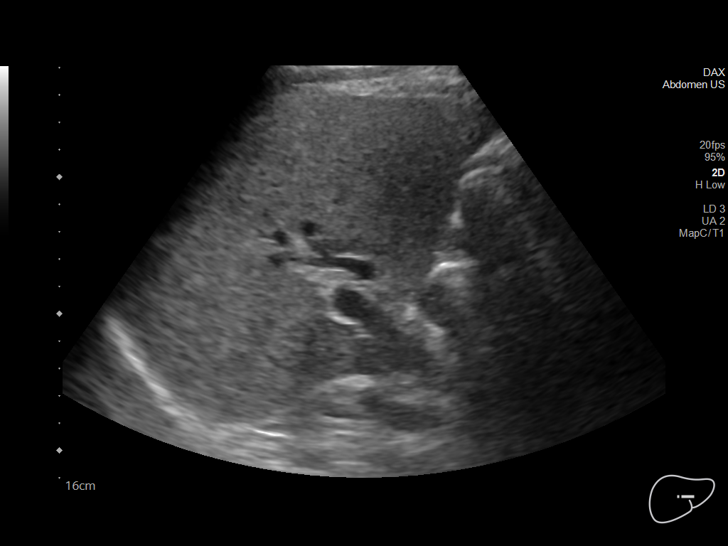
[im 13/29]
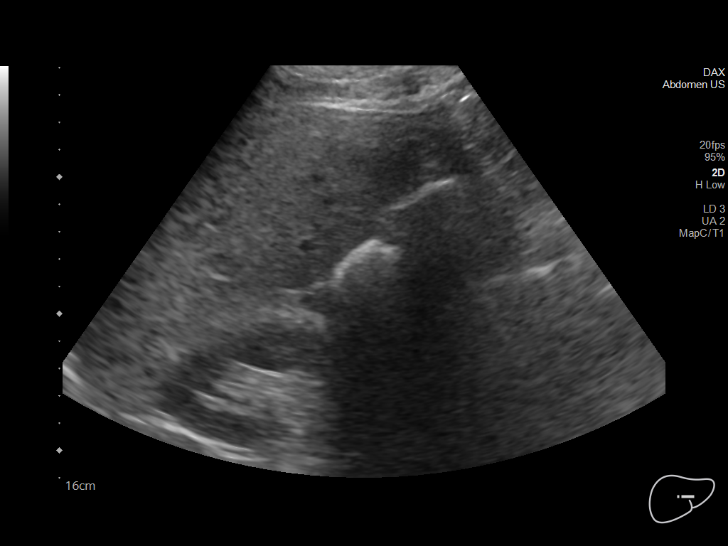
[im 16/29]
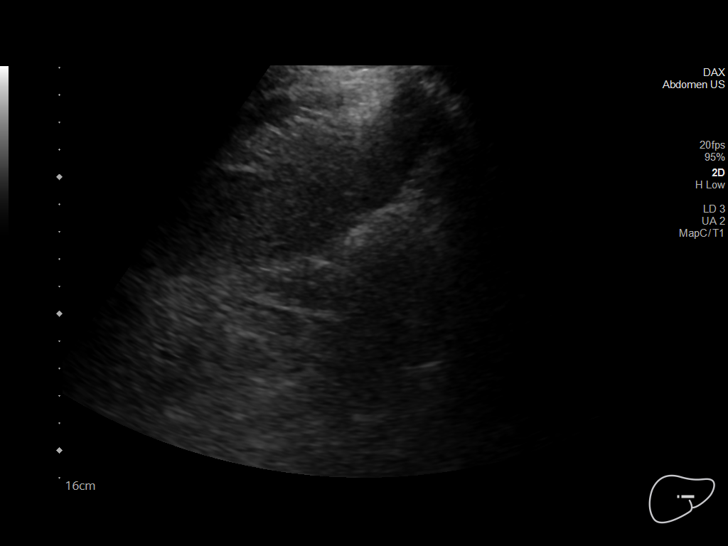
[im 18/29]
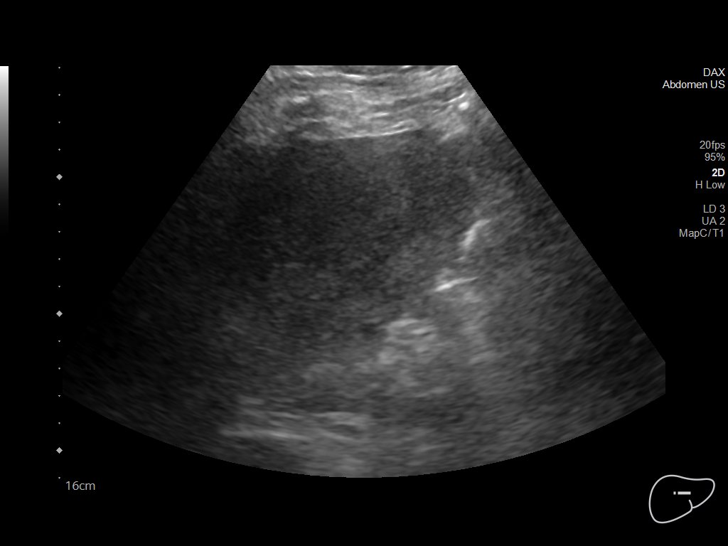
[im 19/29]
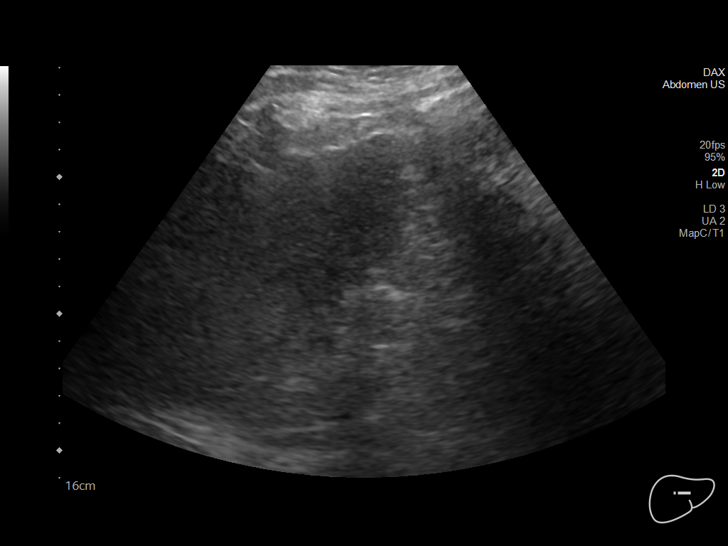
[im 22/29]
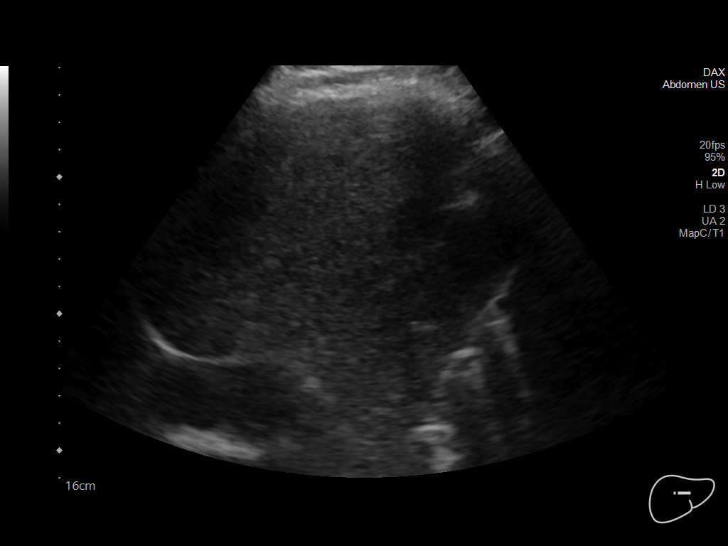
[im 24/29]
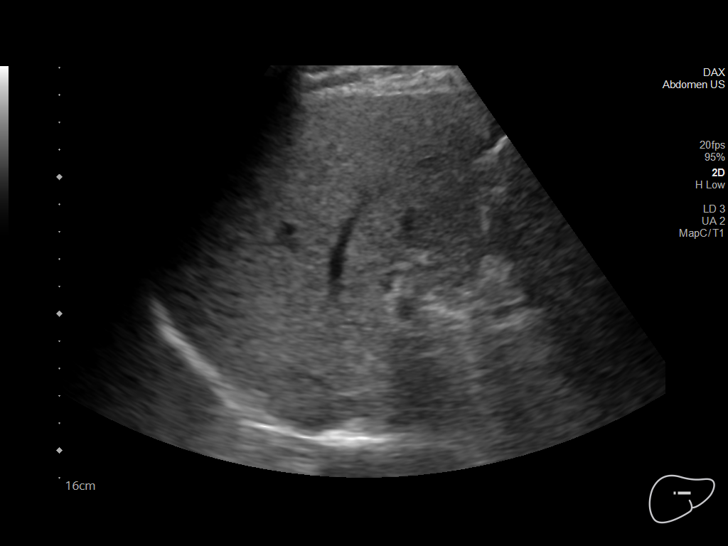
[im 26/29]
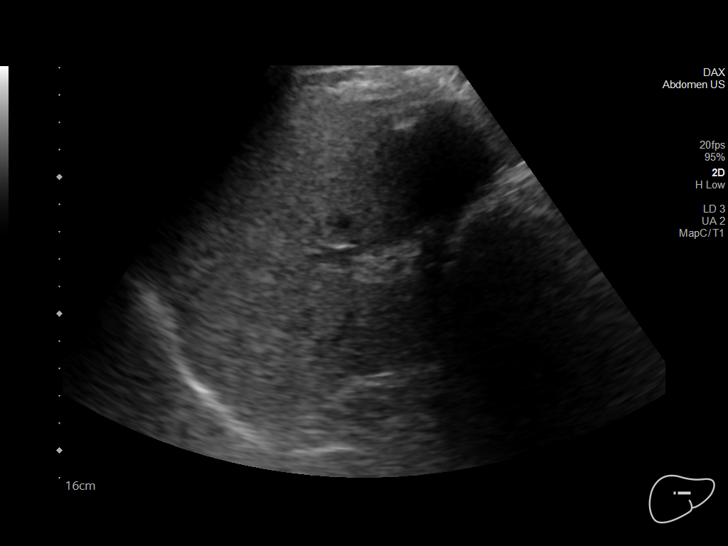
[im 29/29]
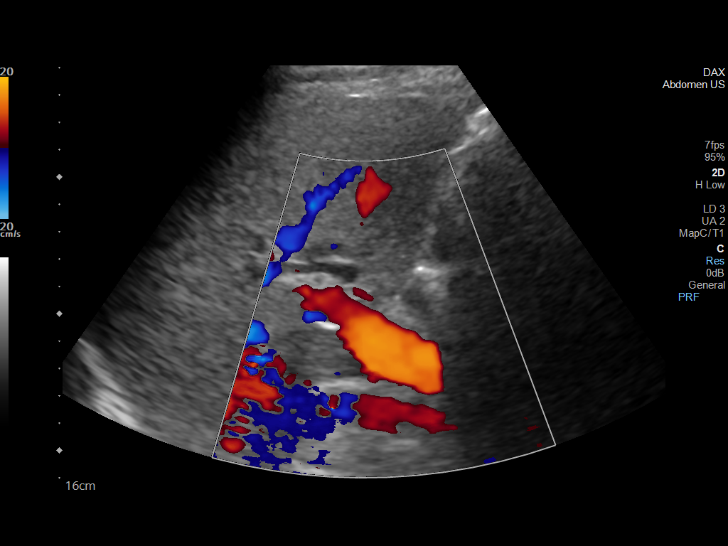

[14 of 25 positions shown; findings below may reference images not displayed]

FINDINGS: Gallbladder:

Surgically removed

Common bile duct:

Diameter: 8.8 mm.

Liver:

Mild heterogeneity of the liver is noted without focal mass.
Generalized increased echogenicity is noted. Portal vein is patent
on color Doppler imaging with normal direction of blood flow towards
the liver.

Other: None.
IMPRESSION: Echogenicity changes in the liver suggestive of fatty infiltration
or early cirrhosis. No discrete mass is noted.
